# Patient Record
Sex: Female | Born: 1965 | Race: White | Hispanic: No | Marital: Married | Smoking: Never smoker
Health system: Southern US, Community
[De-identification: ages and names within clinical notes are randomized; demographics above are authoritative.]

## PROBLEM LIST (undated history)

## (undated) DIAGNOSIS — N200 Calculus of kidney: Secondary | ICD-10-CM

## (undated) DIAGNOSIS — R251 Tremor, unspecified: Secondary | ICD-10-CM

## (undated) DIAGNOSIS — M797 Fibromyalgia: Secondary | ICD-10-CM

## (undated) DIAGNOSIS — F4310 Post-traumatic stress disorder, unspecified: Secondary | ICD-10-CM

## (undated) DIAGNOSIS — F329 Major depressive disorder, single episode, unspecified: Secondary | ICD-10-CM

## (undated) DIAGNOSIS — M549 Dorsalgia, unspecified: Secondary | ICD-10-CM

## (undated) DIAGNOSIS — E039 Hypothyroidism, unspecified: Secondary | ICD-10-CM

## (undated) DIAGNOSIS — F419 Anxiety disorder, unspecified: Secondary | ICD-10-CM

## (undated) DIAGNOSIS — F319 Bipolar disorder, unspecified: Secondary | ICD-10-CM

## (undated) DIAGNOSIS — F32A Depression, unspecified: Secondary | ICD-10-CM

## (undated) DIAGNOSIS — I10 Essential (primary) hypertension: Secondary | ICD-10-CM

## (undated) DIAGNOSIS — G8929 Other chronic pain: Secondary | ICD-10-CM

## (undated) DIAGNOSIS — G43009 Migraine without aura, not intractable, without status migrainosus: Secondary | ICD-10-CM

## (undated) DIAGNOSIS — G43909 Migraine, unspecified, not intractable, without status migrainosus: Secondary | ICD-10-CM

## (undated) DIAGNOSIS — K219 Gastro-esophageal reflux disease without esophagitis: Secondary | ICD-10-CM

## (undated) DIAGNOSIS — K589 Irritable bowel syndrome without diarrhea: Secondary | ICD-10-CM

## (undated) HISTORY — DX: Irritable bowel syndrome, unspecified: K58.9

## (undated) HISTORY — DX: Calculus of kidney: N20.0

## (undated) HISTORY — DX: Post-traumatic stress disorder, unspecified: F43.10

## (undated) HISTORY — PX: OTHER SURGICAL HISTORY: SHX169

## (undated) HISTORY — DX: Dorsalgia, unspecified: M54.9

## (undated) HISTORY — DX: Anxiety disorder, unspecified: F41.9

## (undated) HISTORY — DX: Major depressive disorder, single episode, unspecified: F32.9

## (undated) HISTORY — DX: Fibromyalgia: M79.7

## (undated) HISTORY — DX: Other chronic pain: G89.29

## (undated) HISTORY — DX: Depression, unspecified: F32.A

## (undated) HISTORY — DX: Tremor, unspecified: R25.1

## (undated) HISTORY — DX: Essential (primary) hypertension: I10

## (undated) HISTORY — DX: Migraine without aura, not intractable, without status migrainosus: G43.009

## (undated) HISTORY — DX: Migraine, unspecified, not intractable, without status migrainosus: G43.909

## (undated) HISTORY — DX: Bipolar disorder, unspecified: F31.9

## (undated) HISTORY — PX: ABDOMINAL HYSTERECTOMY: SHX81

---

## 2004-03-01 ENCOUNTER — Emergency Department: Payer: Self-pay | Admitting: Emergency Medicine

## 2005-06-16 ENCOUNTER — Emergency Department: Payer: Self-pay | Admitting: Emergency Medicine

## 2006-02-01 ENCOUNTER — Emergency Department: Payer: Self-pay | Admitting: Emergency Medicine

## 2007-12-25 ENCOUNTER — Ambulatory Visit: Payer: Self-pay | Admitting: Internal Medicine

## 2008-01-13 ENCOUNTER — Emergency Department: Payer: Self-pay | Admitting: Emergency Medicine

## 2008-02-24 ENCOUNTER — Emergency Department: Payer: Self-pay | Admitting: Emergency Medicine

## 2008-09-05 ENCOUNTER — Observation Stay: Payer: Self-pay | Admitting: Internal Medicine

## 2008-09-25 DIAGNOSIS — I10 Essential (primary) hypertension: Secondary | ICD-10-CM | POA: Insufficient documentation

## 2008-10-02 ENCOUNTER — Emergency Department: Payer: Self-pay | Admitting: Emergency Medicine

## 2009-03-13 ENCOUNTER — Emergency Department (HOSPITAL_COMMUNITY): Admission: EM | Admit: 2009-03-13 | Discharge: 2009-03-13 | Payer: Self-pay | Admitting: Emergency Medicine

## 2009-10-21 ENCOUNTER — Emergency Department: Payer: Self-pay | Admitting: Emergency Medicine

## 2010-02-16 ENCOUNTER — Emergency Department: Payer: Self-pay | Admitting: Unknown Physician Specialty

## 2010-03-10 ENCOUNTER — Ambulatory Visit: Payer: Self-pay | Admitting: Gynecology

## 2010-03-19 ENCOUNTER — Ambulatory Visit: Payer: Self-pay | Admitting: Internal Medicine

## 2010-11-03 ENCOUNTER — Ambulatory Visit: Payer: Self-pay | Admitting: Family Medicine

## 2010-11-08 ENCOUNTER — Ambulatory Visit: Payer: Self-pay | Admitting: Family Medicine

## 2011-01-15 ENCOUNTER — Emergency Department: Payer: Self-pay | Admitting: Emergency Medicine

## 2011-03-27 ENCOUNTER — Emergency Department: Payer: Self-pay | Admitting: Emergency Medicine

## 2011-04-09 ENCOUNTER — Emergency Department: Payer: Self-pay | Admitting: Emergency Medicine

## 2012-03-28 ENCOUNTER — Emergency Department: Payer: Self-pay | Admitting: Emergency Medicine

## 2012-03-30 ENCOUNTER — Ambulatory Visit: Payer: Self-pay | Admitting: Podiatry

## 2012-03-30 LAB — CBC WITH DIFFERENTIAL/PLATELET
Basophil #: 0.1 10*3/uL (ref 0.0–0.1)
Basophil %: 1 %
Eosinophil #: 1 10*3/uL — ABNORMAL HIGH (ref 0.0–0.7)
Eosinophil %: 10.9 %
HCT: 38.1 % (ref 35.0–47.0)
HGB: 13 g/dL (ref 12.0–16.0)
Lymphocyte #: 3 10*3/uL (ref 1.0–3.6)
Lymphocyte %: 33.4 %
MCH: 31.2 pg (ref 26.0–34.0)
MCHC: 34.2 g/dL (ref 32.0–36.0)
MCV: 91 fL (ref 80–100)
Monocyte #: 0.8 x10 3/mm (ref 0.2–0.9)
Monocyte %: 8.9 %
Neutrophil #: 4.1 10*3/uL (ref 1.4–6.5)
Neutrophil %: 45.8 %
Platelet: 303 10*3/uL (ref 150–440)
RBC: 4.18 10*6/uL (ref 3.80–5.20)
RDW: 12.9 % (ref 11.5–14.5)
WBC: 9 10*3/uL (ref 3.6–11.0)

## 2012-05-02 ENCOUNTER — Ambulatory Visit: Payer: Self-pay | Admitting: Family Medicine

## 2012-08-04 ENCOUNTER — Emergency Department: Payer: Self-pay | Admitting: Emergency Medicine

## 2012-08-10 DIAGNOSIS — M549 Dorsalgia, unspecified: Secondary | ICD-10-CM | POA: Insufficient documentation

## 2012-09-29 ENCOUNTER — Emergency Department: Payer: Self-pay | Admitting: Emergency Medicine

## 2012-10-29 DIAGNOSIS — G629 Polyneuropathy, unspecified: Secondary | ICD-10-CM | POA: Insufficient documentation

## 2012-10-29 DIAGNOSIS — K219 Gastro-esophageal reflux disease without esophagitis: Secondary | ICD-10-CM | POA: Insufficient documentation

## 2013-02-03 DIAGNOSIS — G5600 Carpal tunnel syndrome, unspecified upper limb: Secondary | ICD-10-CM | POA: Insufficient documentation

## 2013-05-07 DIAGNOSIS — F419 Anxiety disorder, unspecified: Secondary | ICD-10-CM | POA: Insufficient documentation

## 2013-05-24 ENCOUNTER — Ambulatory Visit: Payer: Self-pay | Admitting: Specialist

## 2013-09-16 ENCOUNTER — Emergency Department: Payer: Self-pay | Admitting: Emergency Medicine

## 2013-09-16 LAB — URINALYSIS, COMPLETE
BACTERIA: NONE SEEN
BLOOD: NEGATIVE
Bilirubin,UR: NEGATIVE
GLUCOSE, UR: NEGATIVE mg/dL (ref 0–75)
Ketone: NEGATIVE
LEUKOCYTE ESTERASE: NEGATIVE
NITRITE: NEGATIVE
PH: 7 (ref 4.5–8.0)
PROTEIN: NEGATIVE
RBC,UR: <1 /HPF (ref 0–5)
Specific Gravity: 1.008 (ref 1.003–1.030)
WBC UR: <1 /HPF (ref 0–5)

## 2013-10-02 DIAGNOSIS — R519 Headache, unspecified: Secondary | ICD-10-CM | POA: Insufficient documentation

## 2013-10-02 DIAGNOSIS — R51 Headache: Secondary | ICD-10-CM

## 2013-10-18 ENCOUNTER — Ambulatory Visit: Payer: Self-pay | Admitting: Family Medicine

## 2014-05-23 NOTE — Op Note (Signed)
PATIENT NAME:  Alyssa Zuniga, Alyssa Zuniga MR#:  [id number] DATE OF BIRTH:  April 30, 1965  DATE OF PROCEDURE:  03/30/2012  PREOPERATIVE DIAGNOSIS: Right foot hallux valgus.   POSTOPERATIVE DIAGNOSIS: Right foot hallux valgus.   PROCEDURE PERFORMED:  [place] hallux valgus correction, right foot.   SURGEON: Vandy Tsuchiya A. Ether GriffinsFowler, DPM   ANESTHESIA: General with postoperative block with Exparel, 8 mL.   HEMOSTASIS: Ankle tourniquet inflated to 225 mmHg for 45 minutes.   COMPLICATIONS: None.   SPECIMEN: None.   OPERATIVE INDICATIONS: This is a 49 year old female who has been seen in the outpatient clinic with the complaint of a painful hallux valgus deformity on the right foot. We have discussed conservative versus surgical intervention and she presents today for surgery. All risks, benefits, alternatives and complications associated with surgery were discussed with the patient and informed consent has been given.   DESCRIPTION OF PROCEDURE: The patient was brought into the OR and placed on the operating table in the supine position. General laryngeal mask anesthesia was administered by the anesthesia team. The right lower extremity was then prepped and draped in the usual sterile fashion. After inflation of the tourniquet, a dorsomedial incision was made. Sharp and blunt dissection was taken down to the capsule. Next, a T-capsulotomy was performed and a dorsal medial eminence was noted and transected. Next, a V-osteotomy was created. The capital fragment was translocated laterally. This was stabilized with a 0.062 K wire. The ensuing overhanging ledge was then transected and all areas were smoothed with a power rasp. The foot was loaded. There was still noted to be a slight amount of hallux valgus and tension, in the intermetatarsal space, at this time. I entered the intermetatarsal space. The DTIL was transected. The conjoined tendon of the adductor hallucis was then removed from the base of the proximal phalanx. Much  better range of motion was noted afterwards. The wound was flushed with copious amounts of irrigation. The capsule was closed with a 3-0 Vicryl with a small capsulorrhaphy performed medially, 4-0 Vicryl for the subcutaneous tissue and a 5-0 Monocryl undyed for skin. A well-compressive sterile dressing was placed just after placement of 8 mL of Exparel long-acting Marcaine around the surgical site. Overall the patient tolerated the procedure and anesthesia well and was transported from the OR to the PAC-U with all vital signs stable and neurovascular status intact. I will see her in the outpatient clinic in 5 to 7 days.  ____________________________ Argentina DonovanJustin A. Ether GriffinsFowler, DPM jaf:sb D: 03/30/2012 08:41:58 ET T: 03/30/2012 09:36:09 ET JOB#: [id number]  cc: Jill AlexandersJustin A. Ether GriffinsFowler, DPM, <Dictator> Elbridge Magowan DPM ELECTRONICALLY SIGNED 04/18/2012 9:57

## 2014-07-05 HISTORY — PX: CARDIAC CATHETERIZATION: SHX172

## 2014-07-07 DIAGNOSIS — R0789 Other chest pain: Secondary | ICD-10-CM | POA: Insufficient documentation

## 2014-10-15 ENCOUNTER — Ambulatory Visit: Payer: Managed Care, Other (non HMO) | Attending: Anesthesiology | Admitting: Physical Therapy

## 2014-10-16 ENCOUNTER — Ambulatory Visit: Payer: Managed Care, Other (non HMO) | Admitting: Physical Therapy

## 2014-10-21 ENCOUNTER — Ambulatory Visit: Payer: Self-pay | Admitting: Physical Therapy

## 2014-10-23 ENCOUNTER — Ambulatory Visit: Payer: Self-pay | Admitting: Physical Therapy

## 2014-10-28 ENCOUNTER — Ambulatory Visit: Payer: Self-pay | Admitting: Physical Therapy

## 2014-11-05 ENCOUNTER — Ambulatory Visit (INDEPENDENT_AMBULATORY_CARE_PROVIDER_SITE_OTHER): Payer: Managed Care, Other (non HMO) | Admitting: Unknown Physician Specialty

## 2014-11-05 ENCOUNTER — Encounter: Payer: Self-pay | Admitting: Unknown Physician Specialty

## 2014-11-05 VITALS — BP 149/91 | HR 81 | Temp 97.6°F | Ht 62.2 in | Wt 139.8 lb

## 2014-11-05 DIAGNOSIS — G43909 Migraine, unspecified, not intractable, without status migrainosus: Secondary | ICD-10-CM | POA: Insufficient documentation

## 2014-11-05 DIAGNOSIS — R413 Other amnesia: Secondary | ICD-10-CM | POA: Diagnosis not present

## 2014-11-05 DIAGNOSIS — R61 Generalized hyperhidrosis: Secondary | ICD-10-CM

## 2014-11-05 DIAGNOSIS — E7800 Pure hypercholesterolemia, unspecified: Secondary | ICD-10-CM

## 2014-11-05 DIAGNOSIS — G43109 Migraine with aura, not intractable, without status migrainosus: Secondary | ICD-10-CM | POA: Diagnosis not present

## 2014-11-05 DIAGNOSIS — E039 Hypothyroidism, unspecified: Secondary | ICD-10-CM

## 2014-11-05 DIAGNOSIS — G4700 Insomnia, unspecified: Secondary | ICD-10-CM

## 2014-11-05 DIAGNOSIS — M797 Fibromyalgia: Secondary | ICD-10-CM | POA: Diagnosis not present

## 2014-11-05 MED ORDER — AMITRIPTYLINE HCL 10 MG PO TABS: 10.0000 mg | ORAL_TABLET | Freq: Every day | ORAL | Status: DC

## 2014-11-05 NOTE — Progress Notes (Signed)
BP 149/91 mmHg  Pulse 81  Temp(Src) 97.6 F (36.4 C)  Ht 5' 2.2" (1.58 m)  Wt 139 lb 12.8 oz (63.413 kg)  BMI 25.40 kg/m2  SpO2 97%  LMP  (LMP Unknown)   Subjective:    Patient ID: Alyssa Zuniga, female    DOB: Oct 20, 1965, 49 y.o.   MRN: [id number]  HPI: Alyssa Zuniga is a 49 y.o. female  Chief Complaint  Patient presents with  . Establish Care    pt states she has some vaginal disharge and odor, states she wants to have hormone levels checked   Nausea/vomintins/stomach pain Pt states she feels sick a lot.  She states she is nauseated and vomiting a lot.  Has been to the ER a couple of times.  States they just did labs in the ER.  She states they never did do a scan.  States she gets heart burn and a lot of gas.  She has had trouble with her liver and had scan.  She states she has been feeling this way for quite some time.  At one time felt it was due to so much serotonin.    S/P hysterectomy.  Having a lot of sweating at night.  Pain doctor suggested getting hormones checked.    Chronic pain Disk problems and fibromyalgia  ROS as below.  Pt would like to emphasize memory loss and confusin getting worse, sweating as noted above, headaches sudden sharp pains (not migraines), discharge and odor vaginal area.    Relevant past medical, surgical, family and social history reviewed and updated as indicated. Interim medical history since our last visit reviewed.  Reviewed outside charts from [place] and [place].   Allergies and medications reviewed and updated.  Review of Systems  Constitutional: Positive for diaphoresis, appetite change and fatigue. Negative for activity change.  HENT: Negative.   Eyes: Positive for visual disturbance.  Respiratory: Positive for shortness of breath.   Cardiovascular: Positive for chest pain and palpitations.  Gastrointestinal: Positive for nausea, abdominal pain, diarrhea and abdominal distention. Negative for constipation.  Endocrine: Positive for  cold intolerance and heat intolerance.  Genitourinary: Positive for vaginal discharge.  Musculoskeletal: Positive for myalgias.  Allergic/Immunologic: Negative.   Neurological: Positive for dizziness, weakness and light-headedness. Negative for speech difficulty.  Psychiatric/Behavioral: Positive for sleep disturbance and decreased concentration. The patient is nervous/anxious.     Per HPI unless specifically indicated above     Objective:    BP 149/91 mmHg  Pulse 81  Temp(Src) 97.6 F (36.4 C)  Ht 5' 2.2" (1.58 m)  Wt 139 lb 12.8 oz (63.413 kg)  BMI 25.40 kg/m2  SpO2 97%  LMP  (LMP Unknown)  Wt Readings from Last 3 Encounters:  11/05/14 139 lb 12.8 oz (63.413 kg)    Physical Exam  Constitutional: She is oriented to person, place, and time. She appears well-developed and well-nourished. No distress.  HENT:  Head: Normocephalic and atraumatic.  Eyes: Conjunctivae and lids are normal. Right eye exhibits no discharge. Left eye exhibits no discharge. No scleral icterus.  Neck: Normal range of motion. Neck supple.  Cardiovascular: Normal rate, regular rhythm and normal heart sounds.   Pulmonary/Chest: Effort normal and breath sounds normal. No respiratory distress.  Abdominal: Normal appearance. There is no splenomegaly or hepatomegaly.  Musculoskeletal: Normal range of motion.  Neurological: She is alert and oriented to person, place, and time.  Skin: Skin is warm, dry and intact. Rash noted. No pallor.  Petechial rash on  her back  Psychiatric: She has a normal mood and affect. Her behavior is normal. Judgment and thought content normal.     Assessment & Plan:   Problem List Items Addressed This Visit      Unprioritized   Hypothyroidism - Primary    Last TSH reviewed very low.  ? Cause of daphoresis.  Check TSH today      Relevant Medications   levothyroxine (SYNTHROID, LEVOTHROID) 112 MCG tablet   Diaphoresis    Check TSH      Relevant Orders   TSH   Memory loss     Stop Atorvastatin for a short time.        Relevant Orders   Comprehensive metabolic panel   Hypercholesteremia    LDL in 200's according to Mychart.  Would like a short trial off Atorvastatin and see if it helps memory loss      Relevant Medications   atorvastatin (LIPITOR) 40 MG tablet   Other Relevant Orders   Lipid Panel w/o Chol/HDL Ratio   Migraines   Relevant Medications   atorvastatin (LIPITOR) 40 MG tablet   escitalopram (LEXAPRO) 20 MG tablet   oxyCODONE (ROXICODONE) 15 MG immediate release tablet   naproxen (NAPROSYN) 500 MG tablet   pregabalin (LYRICA) 100 MG capsule   amitriptyline (ELAVIL) 10 MG tablet   Other Relevant Orders   Comprehensive metabolic panel   Insomnia    Stop Benadryl. Stop Topamax.  Start amitrytiline 10 mg QHS.1-2      Relevant Orders   TSH    Other Visit Diagnoses    Fibromyalgia        Relevant Medications    naproxen (NAPROSYN) 500 MG tablet    Other Relevant Orders    Vit D  25 hydroxy (rtn osteoporosis monitoring)    Vitamin B12    Tissue transglutaminase, IgA    Comprehensive metabolic panel        Follow up plan: Return in about 2 weeks (around 11/19/2014) for for physical.

## 2014-11-05 NOTE — Assessment & Plan Note (Signed)
Stop Atorvastatin for a short time.

## 2014-11-05 NOTE — Assessment & Plan Note (Addendum)
Check TSH 

## 2014-11-05 NOTE — Assessment & Plan Note (Signed)
Last TSH reviewed very low.  ? Cause of daphoresis.  Check TSH today

## 2014-11-05 NOTE — Assessment & Plan Note (Signed)
LDL in 200's according to Mychart.  Would like a short trial off Atorvastatin and see if it helps memory loss

## 2014-11-05 NOTE — Assessment & Plan Note (Signed)
Stop Benadryl. Stop Topamax.  Start amitrytiline 10 mg QHS.1-2

## 2014-11-06 ENCOUNTER — Encounter: Payer: Self-pay | Admitting: Unknown Physician Specialty

## 2014-11-06 LAB — COMPREHENSIVE METABOLIC PANEL
A/G RATIO: 1.6 (ref 1.1–2.5)
ALK PHOS: 84 IU/L (ref 39–117)
ALT: 55 IU/L — AB (ref 0–32)
AST: 40 IU/L (ref 0–40)
Albumin: 4.5 g/dL (ref 3.5–5.5)
BILIRUBIN TOTAL: 0.6 mg/dL (ref 0.0–1.2)
BUN / CREAT RATIO: 12 (ref 9–23)
BUN: 11 mg/dL (ref 6–24)
CHLORIDE: 105 mmol/L (ref 97–108)
CO2: 21 mmol/L (ref 18–29)
Calcium: 9.4 mg/dL (ref 8.7–10.2)
Creatinine, Ser: 0.95 mg/dL (ref 0.57–1.00)
GFR calc non Af Amer: 71 mL/min/{1.73_m2} (ref >59)
GFR, EST AFRICAN AMERICAN: 82 mL/min/{1.73_m2} (ref >59)
GLUCOSE: 109 mg/dL — AB (ref 65–99)
Globulin, Total: 2.8 g/dL (ref 1.5–4.5)
POTASSIUM: 3.6 mmol/L (ref 3.5–5.2)
Sodium: 142 mmol/L (ref 134–144)
Total Protein: 7.3 g/dL (ref 6.0–8.5)

## 2014-11-06 LAB — VITAMIN B12: VITAMIN B 12: 517 pg/mL (ref 211–946)

## 2014-11-06 LAB — LIPID PANEL W/O CHOL/HDL RATIO
Cholesterol, Total: 298 mg/dL — ABNORMAL HIGH (ref 100–199)
HDL: 38 mg/dL — ABNORMAL LOW (ref >39)
LDL CALC: 222 mg/dL — AB (ref 0–99)
Triglycerides: 188 mg/dL — ABNORMAL HIGH (ref 0–149)
VLDL CHOLESTEROL CAL: 38 mg/dL (ref 5–40)

## 2014-11-06 LAB — TSH: TSH: 1.48 u[IU]/mL (ref 0.450–4.500)

## 2014-11-06 LAB — VITAMIN D 25 HYDROXY (VIT D DEFICIENCY, FRACTURES): Vit D, 25-Hydroxy: 23.1 ng/mL — ABNORMAL LOW (ref 30.0–100.0)

## 2014-11-08 LAB — TISSUE TRANSGLUTAMINASE, IGA: Transglutaminase IgA: <2 U/mL (ref 0–3)

## 2014-12-10 ENCOUNTER — Encounter: Payer: Managed Care, Other (non HMO) | Admitting: Unknown Physician Specialty

## 2014-12-15 ENCOUNTER — Encounter: Payer: Self-pay | Admitting: Unknown Physician Specialty

## 2014-12-15 ENCOUNTER — Ambulatory Visit (INDEPENDENT_AMBULATORY_CARE_PROVIDER_SITE_OTHER): Payer: Managed Care, Other (non HMO) | Admitting: Unknown Physician Specialty

## 2014-12-15 VITALS — BP 170/113 | HR 77 | Temp 97.5°F | Ht 61.2 in | Wt 143.4 lb

## 2014-12-15 DIAGNOSIS — G8929 Other chronic pain: Secondary | ICD-10-CM

## 2014-12-15 DIAGNOSIS — M797 Fibromyalgia: Secondary | ICD-10-CM | POA: Diagnosis not present

## 2014-12-15 DIAGNOSIS — R1110 Vomiting, unspecified: Secondary | ICD-10-CM | POA: Insufficient documentation

## 2014-12-15 DIAGNOSIS — M7502 Adhesive capsulitis of left shoulder: Secondary | ICD-10-CM

## 2014-12-15 DIAGNOSIS — Z0000 Encounter for general adult medical examination without abnormal findings: Secondary | ICD-10-CM | POA: Diagnosis not present

## 2014-12-15 DIAGNOSIS — G43109 Migraine with aura, not intractable, without status migrainosus: Secondary | ICD-10-CM

## 2014-12-15 DIAGNOSIS — G43A1 Cyclical vomiting, intractable: Secondary | ICD-10-CM | POA: Diagnosis not present

## 2014-12-15 DIAGNOSIS — M7500 Adhesive capsulitis of unspecified shoulder: Secondary | ICD-10-CM | POA: Insufficient documentation

## 2014-12-15 DIAGNOSIS — E7800 Pure hypercholesterolemia, unspecified: Secondary | ICD-10-CM | POA: Diagnosis not present

## 2014-12-15 DIAGNOSIS — E559 Vitamin D deficiency, unspecified: Secondary | ICD-10-CM | POA: Diagnosis not present

## 2014-12-15 DIAGNOSIS — R1115 Cyclical vomiting syndrome unrelated to migraine: Secondary | ICD-10-CM

## 2014-12-15 NOTE — Assessment & Plan Note (Signed)
X-ray and PT referral

## 2014-12-15 NOTE — Assessment & Plan Note (Signed)
rx for ondansetron

## 2014-12-15 NOTE — Assessment & Plan Note (Signed)
Take 2 K/day

## 2014-12-15 NOTE — Patient Instructions (Addendum)
f you have chronic pain and are looking for alternatives to medication and surgery, you have a lot of options.   However, not all alternative pain treatments work. Some can even be risky. Some alternative treatments may help with pain from bad backs, osteoarthritis, and headaches, but have no effect on chronic pain from fibromyalgia or diabetic nerve damage.  Here's a rundown of the most commonly used alternative treatments for chronic pain.  Acupuncture. Once seen as bizarre, acupuncture is rapidly becoming a mainstream treatment for pain. Studies have found that it works for pain caused by many conditions, including fibromyalgia, osteoarthritis, back injuries, and sports injuries. How does it work? No one's quite sure. It could release pain-numbing chemicals in the body. Or it might block the pain signals coming from the nerves.  Exercise. Motion is lotion Going for a walk or swim isn't a treatment, exactly. But regular physical activity has big benefits for people with many different painful conditions. Study after study has found that physical activity can help relieve chronic pain, as well as boost energy and mood.   This is particularly true of pain related to arthritis .    Chiropractic manipulation. Although mainstream medicine has traditionally regarded spinal manipulation with suspicion, it's becoming a more accepted treatment.  I think many people respond will th chiropractic treatment.    Supplements and vitamins. There is evidence that certain dietary supplements and vitamins can help with certain types of pain. Fish oil and flaxseed oil is often used to reduce pain associated with swelling. Topical capsaicin, derived from chili peppers, may help with arthritis, diabetic nerve pain, and other conditions. There's evidence that glucosamine can help relieve moderate to severe pain from osteoarthritis in the knee.  A spice Tumeric is used my many to treat chronic pain.  Curcumin is the active  ingredient and thought to have anti-inflammatory effects and reduces pain and stiffness.  This can be found as capsules or extract (more likely to be free of contaminants). For OA: Capsule, typically 400 mg to 600 mg, three times per day; or 0.5 g to 1 g of powdered root up to 3 g per day. For RA: 500 mg twice daily.  It's best absorbed if it contains black pepper.  Tart cherry juice is a natural anti-inflammatory agent.  We sometimes hear from people who would like to know where to find cherries out of season. Some report that their local market does not carry cherry juice. There are a number of reputable online vendors who could supply cherry concentrate so you can use cherry juice to see if it helps your arthritic joints. Studies have been done with powdered [place] cherries, CherryPURE. The usual dose is 480 mg/day.  But when it comes to supplements, you have to be careful. High doses of B6 can damage the nerves.   Some studies suggest that supplements such as ginkgo biloba and ginseng can thin the blood and increase the risk of bleeding. This could lead to serious consequences for anyone getting surgery for chronic pain.  Finally, supplements don't always contain what they claim as supplement manufacturers are not regulated by the FDA.  I get very confused with the thousands of supplements available and which to choose.  Brands to consider: [employer], [employer], [employer], Garden [employer] Life, [employer], [employer] and [employer]   I usually go on Amazon and look for the highly rated products.  [employer] or [employer] are great resources and have done the research   for you.     Cognitive Behavioral Therapy. Some people with chronic pain balk at the idea of seeing a therapist -- they think it implies that their pain isn't real. But studies show that depression and chronic pain often go together. Chronic pain can cause or worsen depression; depression can lower a person's tolerance for  pain.  Stress-reduction techniques. There are number of approaches, including: Yoga. There's good evidence that yoga can help with chronic pain,  specifically fibromyalgia, neck pain, back pain, and arthritis. Relaxation therapy. This is actually a category of techniques that help people calm the body and release tension -- a process that might also reduce pain. Some approaches teach people how to focus on their breathing. Research shows that relaxation therapy can help with fibromyalgia, headache, osteoarthritis, and other conditions. Hypnosis. Studies have found this approach helpful with different sorts of pain, like back pain, repetitive strain injuries, and cancer pain. Guided imagery. Research shows that guided imagery can help with conditions like headache pain, cancer pain, osteoarthritis, and fibromyalgia. How does it work? An expert would teach you ways to direct your thoughts by focusing on specific images. Music therapy. This approach gets people to either perform or listen to music. Studies have found that it can help with many different pain conditions, like osteoarthritis and cancer pain. Biofeedback. This approach teaches you how to control normally unconscious bodily functions, like blood pressure or your heart rate. Studies have found that it can help with headaches, fibromyalgia, and other conditions. Massage. It's undeniably relaxing. And there's some evidence that massage can help ease pain from rheumatoid arthritis, neck and back injuries, and fibromyalgia.  Risky Alternative Pain Treatments Experts say you should keep your expectations for alternative pain treatments modest -- especially when it comes to "miracle cures." Controlling chronic pain is not simple. A single supplement, device, or treatment is not going to make your chronic pain disappear. You a need to be suspicious of anyone pushing a treatment when the financial motive is blatant. That doesn't only apply to pain  treatments advertised on dubious web sites asking for your credit card number.  Experts say that you should try to keep up to date with research into alternative treatments for chronic pain. The options for people with chronic pain are always growing -- and some of the odder treatments of today might become the mainstream treatments of tomorrow.   Read book From Fit to Fantastic by Dr. Nani Gasserietlebaum  No sugar diet  Start 2K vitamin/day  Start daily Tumeric  Magnesium citrate Natural Calm

## 2014-12-15 NOTE — Assessment & Plan Note (Signed)
Discuss diet.  Encouraged sugar free.  Discussed a gluten free diet

## 2014-12-15 NOTE — Progress Notes (Signed)
BP 170/113 mmHg  Pulse 77  Temp(Src) 97.5 F (36.4 C)  Ht 5' 1.2" (1.554 m)  Wt 143 lb 6.4 oz (65.046 kg)  BMI 26.94 kg/m2  SpO2 97%  LMP  (LMP Unknown)   Subjective:    Patient ID: Alyssa Zuniga, female    DOB: Jan 19, 1966, 49 y.o.   MRN: [id number]  HPI: Alyssa Zuniga is a 49 y.o. female  Chief Complaint  Patient presents with  . Annual Exam  . Nausea    pt states she has had nausea and vomitting that got bad about 2 weeks ago  . Generalized Body Aches    pt states she has body aches and a sore throat  . Shoulder Pain    pt states she is having left shoulder pain. States she tore rotator cuff a few years ago and thinks she reinjured it   Last visit we stopped her cholesterol medication which made no difference in her symptoms of memory loss and pain  History of cyclical nausea and vomiting which really got bad.  Despite going to the ER multiple times she has never went to a GI physician.    Low Vitamin D.  Just started a multi vitamin supplement that had 2K Vit D in it.    Reinjured left shoulder last year.  Thinks she has a rotator cuff.  Tried PT without improvement.    Migraines.  Restarted Topamax.    Relevant past medical, surgical, family and social history reviewed and updated as indicated. Interim medical history since our last visit reviewed. Allergies and medications reviewed and updated.  Review of Systems  Constitutional: Positive for fatigue.  HENT: Positive for sore throat.   Genitourinary:       Total hysterectomy.  Mammogram 2 years ago    Per HPI unless specifically indicated above     Objective:    BP 170/113 mmHg  Pulse 77  Temp(Src) 97.5 F (36.4 C)  Ht 5' 1.2" (1.554 m)  Wt 143 lb 6.4 oz (65.046 kg)  BMI 26.94 kg/m2  SpO2 97%  LMP  (LMP Unknown)  Wt Readings from Last 3 Encounters:  12/15/14 143 lb 6.4 oz (65.046 kg)  11/05/14 139 lb 12.8 oz (63.413 kg)    Physical Exam  Constitutional: She is oriented to person, place, and  time. She appears well-developed and well-nourished.  HENT:  Head: Normocephalic and atraumatic.  Eyes: Pupils are equal, round, and reactive to light. Right eye exhibits no discharge. Left eye exhibits no discharge. No scleral icterus.  Neck: Normal range of motion. Neck supple. Carotid bruit is not present. No thyromegaly present.  Cardiovascular: Normal rate, regular rhythm and normal heart sounds.  Exam reveals no gallop and no friction rub.   No murmur heard. Pulmonary/Chest: Effort normal and breath sounds normal. No respiratory distress. She has no wheezes. She has no rales.  Abdominal: Soft. Bowel sounds are normal. There is no tenderness. There is no rebound.  Genitourinary: No breast swelling, tenderness or discharge.  Musculoskeletal:       Left shoulder: She exhibits decreased range of motion and tenderness.  Lymphadenopathy:    She has no cervical adenopathy.  Neurological: She is alert and oriented to person, place, and time.  Skin: Skin is warm, dry and intact. No rash noted.  Psychiatric: She has a normal mood and affect. Her speech is normal and behavior is normal. Judgment and thought content normal. Cognition and memory are normal.    Results for  orders placed or performed in visit on 11/05/14  Lipid Panel w/o Chol/HDL Ratio  Result Value Ref Range   Cholesterol, Total 298 (H) 100 - 199 mg/dL   Triglycerides 409 (H) 0 - 149 mg/dL   HDL 38 (L) >81 mg/dL   VLDL Cholesterol Cal 38 5 - 40 mg/dL   LDL Calculated 191 (H) 0 - 99 mg/dL  TSH  Result Value Ref Range   TSH 1.480 0.450 - 4.500 uIU/mL  Vit D  25 hydroxy (rtn osteoporosis monitoring)  Result Value Ref Range   Vit D, 25-Hydroxy 23.1 (L) 30.0 - 100.0 ng/mL  Vitamin B12  Result Value Ref Range   Vitamin B-12 517 211 - 946 pg/mL  Tissue transglutaminase, IgA  Result Value Ref Range   Transglutaminase IgA <2 0 - 3 U/mL  Comprehensive metabolic panel  Result Value Ref Range   Glucose 109 (H) 65 - 99 mg/dL    BUN 11 6 - 24 mg/dL   Creatinine, Ser 4.78 0.57 - 1.00 mg/dL   GFR calc non Af Amer 71 >59 mL/min/1.73   GFR calc Af Amer 82 >59 mL/min/1.73   BUN/Creatinine Ratio 12 9 - 23   Sodium 142 134 - 144 mmol/L   Potassium 3.6 3.5 - 5.2 mmol/L   Chloride 105 97 - 108 mmol/L   CO2 21 18 - 29 mmol/L   Calcium 9.4 8.7 - 10.2 mg/dL   Total Protein 7.3 6.0 - 8.5 g/dL   Albumin 4.5 3.5 - 5.5 g/dL   Globulin, Total 2.8 1.5 - 4.5 g/dL   Albumin/Globulin Ratio 1.6 1.1 - 2.5   Bilirubin Total 0.6 0.0 - 1.2 mg/dL   Alkaline Phosphatase 84 39 - 117 IU/L   AST 40 0 - 40 IU/L   ALT 55 (H) 0 - 32 IU/L      Assessment & Plan:   Problem List Items Addressed This Visit      Unprioritized   Hypercholesteremia    Restart cholesterol meds      Migraines   Relevant Medications   topiramate (TOPAMAX) 50 MG tablet   oxyCODONE (OXYCONTIN) 20 mg 12 hr tablet   Oxycodone HCl 10 MG TABS   Fibromyalgia - Primary    Discuss diet.  Encouraged sugar free.  Discussed a gluten free diet      Chronic pain   Relevant Medications   topiramate (TOPAMAX) 50 MG tablet   oxyCODONE (OXYCONTIN) 20 mg 12 hr tablet   Oxycodone HCl 10 MG TABS   Vitamin D deficiency    Take 2 K/day      Frozen shoulder    X-ray and PT referral      Relevant Medications   oxyCODONE (OXYCONTIN) 20 mg 12 hr tablet   Oxycodone HCl 10 MG TABS   Other Relevant Orders   DG Shoulder Left   Ambulatory referral to Physical Therapy   Hyperemesis    rx for ondansetron       Other Visit Diagnoses    Routine general medical examination at a health care facility        Annual physical exam        Relevant Orders    MM DIGITAL SCREENING BILATERAL        Follow up plan: Return in about 6 weeks (around 01/26/2015).

## 2014-12-15 NOTE — Assessment & Plan Note (Signed)
Restart cholesterol meds 

## 2014-12-16 ENCOUNTER — Telehealth: Payer: Self-pay | Admitting: Unknown Physician Specialty
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
## 2014-12-17 NOTE — Telephone Encounter (Signed)
Patient returned my call and I let her know what Elnita MaxwellCheryl said.

## 2014-12-17 NOTE — Telephone Encounter (Signed)
Called and left patient a voicemail asking for her to please return my call.  

## 2014-12-17 NOTE — Telephone Encounter (Signed)
Thanks.  Please tell her she should let her pain doctor know as he prescribes most of her sedating medications.

## 2014-12-30 ENCOUNTER — Other Ambulatory Visit: Payer: Self-pay | Admitting: Unknown Physician Specialty

## 2014-12-30 ENCOUNTER — Ambulatory Visit: Payer: Managed Care, Other (non HMO) | Admitting: Physical Therapy

## 2014-12-31 ENCOUNTER — Other Ambulatory Visit: Payer: Self-pay

## 2014-12-31 MED ORDER — ESTRADIOL 1 MG PO TABS: 1.0000 mg | ORAL_TABLET | Freq: Every day | ORAL | Status: DC

## 2014-12-31 NOTE — Telephone Encounter (Signed)
Megan from [facility] in [place] called requesting a refill for patient's estradiol medication. Patient was last seen 12/15/14 and pharmacy is [facility] [place].

## 2015-01-05 ENCOUNTER — Ambulatory Visit: Payer: Self-pay | Admitting: Physical Therapy

## 2015-01-07 ENCOUNTER — Ambulatory Visit: Payer: Self-pay | Admitting: Physical Therapy

## 2015-01-08 ENCOUNTER — Encounter: Payer: Self-pay | Admitting: Unknown Physician Specialty

## 2015-01-12 ENCOUNTER — Ambulatory Visit: Payer: Managed Care, Other (non HMO) | Attending: Anesthesiology | Admitting: Physical Therapy

## 2015-01-14 ENCOUNTER — Ambulatory Visit (INDEPENDENT_AMBULATORY_CARE_PROVIDER_SITE_OTHER): Payer: Managed Care, Other (non HMO) | Admitting: Unknown Physician Specialty

## 2015-01-14 ENCOUNTER — Encounter: Payer: Self-pay | Admitting: Internal Medicine

## 2015-01-14 ENCOUNTER — Encounter: Payer: Self-pay | Admitting: Unknown Physician Specialty

## 2015-01-14 VITALS — BP 143/92 | HR 73 | Temp 97.2°F | Ht 62.2 in | Wt 139.8 lb

## 2015-01-14 DIAGNOSIS — R413 Other amnesia: Secondary | ICD-10-CM | POA: Diagnosis not present

## 2015-01-14 DIAGNOSIS — R159 Full incontinence of feces: Secondary | ICD-10-CM

## 2015-01-14 DIAGNOSIS — G43109 Migraine with aura, not intractable, without status migrainosus: Secondary | ICD-10-CM | POA: Diagnosis not present

## 2015-01-14 DIAGNOSIS — Z5181 Encounter for therapeutic drug level monitoring: Secondary | ICD-10-CM

## 2015-01-14 DIAGNOSIS — R251 Tremor, unspecified: Secondary | ICD-10-CM | POA: Diagnosis not present

## 2015-01-14 DIAGNOSIS — R197 Diarrhea, unspecified: Secondary | ICD-10-CM

## 2015-01-14 DIAGNOSIS — E7800 Pure hypercholesterolemia, unspecified: Secondary | ICD-10-CM | POA: Diagnosis not present

## 2015-01-14 DIAGNOSIS — R32 Unspecified urinary incontinence: Secondary | ICD-10-CM

## 2015-01-14 DIAGNOSIS — M797 Fibromyalgia: Secondary | ICD-10-CM | POA: Diagnosis not present

## 2015-01-14 DIAGNOSIS — G43A1 Cyclical vomiting, intractable: Secondary | ICD-10-CM | POA: Diagnosis not present

## 2015-01-14 DIAGNOSIS — R1115 Cyclical vomiting syndrome unrelated to migraine: Secondary | ICD-10-CM

## 2015-01-14 LAB — LIPID PANEL PICCOLO, WAIVED
CHOL/HDL RATIO PICCOLO,WAIVE: 6.1 mg/dL — AB
CHOLESTEROL PICCOLO, WAIVED: 214 mg/dL — AB (ref <200)
HDL CHOL PICCOLO, WAIVED: 35 mg/dL — AB (ref >59)
LDL CHOL CALC PICCOLO WAIVED: 132 mg/dL — AB (ref <100)
TRIGLYCERIDES PICCOLO,WAIVED: 234 mg/dL — AB (ref <150)
VLDL Chol Calc Piccolo,Waive: 47 mg/dL — ABNORMAL HIGH (ref <30)

## 2015-01-14 NOTE — Progress Notes (Signed)
BP 143/92 mmHg  Pulse 73  Temp(Src) 97.2 F (36.2 C)  Ht 5' 2.2" (1.58 m)  Wt 139 lb 12.8 oz (63.413 kg)  BMI 25.40 kg/m2  SpO2 98%  LMP  (LMP Unknown)   Subjective:    Patient ID: Alyssa Zuniga, female    DOB: Dec 06, 1965, 49 y.o.   MRN: [id number]  HPI: Alyssa CrowLinda G Schweigert is a 49 y.o. female  Chief Complaint  Patient presents with  . Dizziness    pt states she has been having dizziness, nausea, vomitting, SOB, headaches that turn to migraines, blurred/double vision, and bad tremors. Patient states symptoms started last Tuesday.   States she has the above symptoms and states "it is really bad."  Her pain doctor says this has nothing to do with her pain medications.  She is drinking a lot of lemon water and cutting back on sugar.  She feels she has been dehydrated due to vomiting and diarrhea.  She was so bad she couldn't get out of bed.  This lasted 4 days.  She has never seen a GI doctor.  She was tested for lupus once and said ANA was "off the charts" butnever checked again.    She has been doing a lot of research.  She does have tremors, incontinence of bowel or bladder, blurred vision, "dead peripheral nerves," short term memory loss, intermittent aphasia, and migraines. She wonders if she can have a MRI and wonders if she has MS.  She has never seen a neurologist.   Hyperlipidemia   High last visit.  Restarted on cholesterol meds.    Relevant past medical, surgical, family and social history reviewed and updated as indicated. Interim medical history since our last visit reviewed. Allergies and medications reviewed and updated.  Review of Systems  Per HPI unless specifically indicated above     Objective:    BP 143/92 mmHg  Pulse 73  Temp(Src) 97.2 F (36.2 C)  Ht 5' 2.2" (1.58 m)  Wt 139 lb 12.8 oz (63.413 kg)  BMI 25.40 kg/m2  SpO2 98%  LMP  (LMP Unknown)  Wt Readings from Last 3 Encounters:  01/14/15 139 lb 12.8 oz (63.413 kg)  12/15/14 143 lb 6.4 oz (65.046 kg)   11/05/14 139 lb 12.8 oz (63.413 kg)    Physical Exam  Results for orders placed or performed in visit on 11/05/14  Lipid Panel w/o Chol/HDL Ratio  Result Value Ref Range   Cholesterol, Total 298 (H) 100 - 199 mg/dL   Triglycerides 409188 (H) 0 - 149 mg/dL   HDL 38 (L) >81>39 mg/dL   VLDL Cholesterol Cal 38 5 - 40 mg/dL   LDL Calculated 191222 (H) 0 - 99 mg/dL  TSH  Result Value Ref Range   TSH 1.480 0.450 - 4.500 uIU/mL  Vit D  25 hydroxy (rtn osteoporosis monitoring)  Result Value Ref Range   Vit D, 25-Hydroxy 23.1 (L) 30.0 - 100.0 ng/mL  Vitamin B12  Result Value Ref Range   Vitamin B-12 517 211 - 946 pg/mL  Tissue transglutaminase, IgA  Result Value Ref Range   Transglutaminase IgA <2 0 - 3 U/mL  Comprehensive metabolic panel  Result Value Ref Range   Glucose 109 (H) 65 - 99 mg/dL   BUN 11 6 - 24 mg/dL   Creatinine, Ser 4.780.95 0.57 - 1.00 mg/dL   GFR calc non Af Amer 71 >59 mL/min/1.73   GFR calc Af Amer 82 >59 mL/min/1.73   BUN/Creatinine Ratio 12  9 - 23   Sodium 142 134 - 144 mmol/L   Potassium 3.6 3.5 - 5.2 mmol/L   Chloride 105 97 - 108 mmol/L   CO2 21 18 - 29 mmol/L   Calcium 9.4 8.7 - 10.2 mg/dL   Total Protein 7.3 6.0 - 8.5 g/dL   Albumin 4.5 3.5 - 5.5 g/dL   Globulin, Total 2.8 1.5 - 4.5 g/dL   Albumin/Globulin Ratio 1.6 1.1 - 2.5   Bilirubin Total 0.6 0.0 - 1.2 mg/dL   Alkaline Phosphatase 84 39 - 117 IU/L   AST 40 0 - 40 IU/L   ALT 55 (H) 0 - 32 IU/L      Assessment & Plan:   Problem List Items Addressed This Visit      Unprioritized   Memory loss   Relevant Orders   Ambulatory referral to Neurology   Antinuclear Antib (ANA)   Comprehensive metabolic panel   Hypercholesteremia   Relevant Orders   Lipid Panel Piccolo, Waived   Migraines   Relevant Medications   amitriptyline (ELAVIL) 10 MG tablet   Other Relevant Orders   Ambulatory referral to Neurology   Fibromyalgia   Relevant Orders   Antinuclear Antib (ANA)   Comprehensive metabolic panel    Hyperemesis   Relevant Orders   Ambulatory referral to Gastroenterology   Tremors of nervous system - Primary   Relevant Orders   Ambulatory referral to Neurology   Antinuclear Antib (ANA)   Incontinence of bowel   Relevant Orders   Ambulatory referral to Neurology   Incontinence of urine   Relevant Orders   Ambulatory referral to Neurology    Other Visit Diagnoses    Diarrhea, unspecified type        Relevant Orders    Ambulatory referral to Gastroenterology    Comprehensive metabolic panel    Medication monitoring encounter        Relevant Orders    Comprehensive metabolic panel       Pt with constellation of neurological complaints.  ? MS ? Migraine syndrome ? Fibromyalgic type symptoms.  .  Refer to neurology  Refer to GI with episodic diarrhea and hyperemesis  Follow up plan: Return in about 3 months (around 04/14/2015).

## 2015-01-15 ENCOUNTER — Other Ambulatory Visit: Payer: Self-pay | Admitting: Unknown Physician Specialty

## 2015-01-15 LAB — COMPREHENSIVE METABOLIC PANEL
ALBUMIN: 3.7 g/dL (ref 3.5–5.5)
ALK PHOS: 80 IU/L (ref 39–117)
ALT: 55 IU/L — AB (ref 0–32)
AST: 47 IU/L — AB (ref 0–40)
Albumin/Globulin Ratio: 1.5 (ref 1.1–2.5)
BILIRUBIN TOTAL: 0.7 mg/dL (ref 0.0–1.2)
BUN/Creatinine Ratio: 12 (ref 9–23)
BUN: 10 mg/dL (ref 6–24)
CHLORIDE: 102 mmol/L (ref 96–106)
CO2: 25 mmol/L (ref 18–29)
CREATININE: 0.81 mg/dL (ref 0.57–1.00)
Calcium: 9 mg/dL (ref 8.7–10.2)
GFR calc Af Amer: 99 mL/min/{1.73_m2} (ref >59)
GFR calc non Af Amer: 86 mL/min/{1.73_m2} (ref >59)
GLUCOSE: 97 mg/dL (ref 65–99)
Globulin, Total: 2.5 g/dL (ref 1.5–4.5)
Potassium: 3.6 mmol/L (ref 3.5–5.2)
Sodium: 142 mmol/L (ref 134–144)
Total Protein: 6.2 g/dL (ref 6.0–8.5)

## 2015-01-15 LAB — ANA: ANA: POSITIVE — AB

## 2015-01-15 NOTE — Telephone Encounter (Signed)
Pt called stated she is out of her Amitriptyline. Needs the medication ASAP. Pharm is [facility] in [place]. Thanks.

## 2015-01-16 MED ORDER — AMITRIPTYLINE HCL 10 MG PO TABS: 10.0000 mg | ORAL_TABLET | Freq: Every day | ORAL | Status: DC | PRN

## 2015-01-16 NOTE — Telephone Encounter (Signed)
Patient was last seen 01/14/15 and pharmacy is [facility] in [place].

## 2015-01-27 ENCOUNTER — Ambulatory Visit: Payer: Managed Care, Other (non HMO) | Admitting: Unknown Physician Specialty

## 2015-01-29 ENCOUNTER — Telehealth: Payer: Self-pay | Admitting: Unknown Physician Specialty

## 2015-01-29 ENCOUNTER — Other Ambulatory Visit: Payer: Self-pay | Admitting: Unknown Physician Specialty

## 2015-01-29 DIAGNOSIS — R768 Other specified abnormal immunological findings in serum: Secondary | ICD-10-CM

## 2015-01-29 MED ORDER — POTASSIUM CHLORIDE CRYS ER 20 MEQ PO TBCR: 20.0000 meq | EXTENDED_RELEASE_TABLET | Freq: Every day | ORAL | Status: DC

## 2015-01-29 MED ORDER — TOPIRAMATE 50 MG PO TABS: 50.0000 mg | ORAL_TABLET | Freq: Two times a day (BID) | ORAL | Status: DC

## 2015-01-29 MED ORDER — ATORVASTATIN CALCIUM 40 MG PO TABS: 40.0000 mg | ORAL_TABLET | Freq: Every day | ORAL | Status: DC

## 2015-01-29 MED ORDER — LEVOTHYROXINE SODIUM 112 MCG PO TABS: 112.0000 ug | ORAL_TABLET | Freq: Every day | ORAL | Status: DC

## 2015-01-29 MED ORDER — AMITRIPTYLINE HCL 10 MG PO TABS: 10.0000 mg | ORAL_TABLET | Freq: Every day | ORAL | Status: DC | PRN

## 2015-01-29 MED ORDER — ESTRADIOL 1 MG PO TABS: 1.0000 mg | ORAL_TABLET | Freq: Every day | ORAL | Status: DC

## 2015-01-29 MED ORDER — OMEPRAZOLE 40 MG PO CPDR: 40.0000 mg | DELAYED_RELEASE_CAPSULE | Freq: Every day | ORAL | Status: DC

## 2015-01-29 NOTE — Telephone Encounter (Signed)
Pt needs all meds sent for 90 day supply to [facility] [phone].

## 2015-01-29 NOTE — Telephone Encounter (Signed)
Routing to provider. Patient was seen 01/14/15. Pharmacy was changed to patient's preference in chart.

## 2015-02-03 ENCOUNTER — Ambulatory Visit: Payer: Managed Care, Other (non HMO) | Admitting: Internal Medicine

## 2015-02-03 ENCOUNTER — Other Ambulatory Visit: Payer: Self-pay | Admitting: Unknown Physician Specialty

## 2015-02-05 ENCOUNTER — Ambulatory Visit
Admission: EM | Admit: 2015-02-05 | Discharge: 2015-02-05 | Disposition: A | Discharge status: Home / Self Care | Payer: Managed Care, Other (non HMO) | Attending: Family Medicine | Admitting: Family Medicine

## 2015-02-05 ENCOUNTER — Encounter: Payer: Self-pay | Admitting: Gynecology

## 2015-02-05 DIAGNOSIS — J029 Acute pharyngitis, unspecified: Secondary | ICD-10-CM | POA: Diagnosis not present

## 2015-02-05 DIAGNOSIS — J020 Streptococcal pharyngitis: Secondary | ICD-10-CM | POA: Diagnosis not present

## 2015-02-05 DIAGNOSIS — G4700 Insomnia, unspecified: Secondary | ICD-10-CM

## 2015-02-05 DIAGNOSIS — R110 Nausea: Secondary | ICD-10-CM

## 2015-02-05 LAB — RAPID INFLUENZA A&B ANTIGENS (ARMC ONLY)
INFLUENZA A (ARMC): NOT DETECTED
INFLUENZA B (ARMC): NOT DETECTED

## 2015-02-05 LAB — RAPID STREP SCREEN (MED CTR MEBANE ONLY): Streptococcus, Group A Screen (Direct): POSITIVE — AB

## 2015-02-05 MED ORDER — AZITHROMYCIN 250 MG PO TABS: ORAL_TABLET | ORAL | Status: DC

## 2015-02-05 MED ORDER — BUTALBITAL-APAP-CAFFEINE 50-325-40 MG PO TABS: 1.0000 | ORAL_TABLET | Freq: Four times a day (QID) | ORAL | Status: DC | PRN

## 2015-02-05 MED ORDER — ONDANSETRON 8 MG PO TBDP: 8.0000 mg | ORAL_TABLET | Freq: Three times a day (TID) | ORAL | Status: DC | PRN

## 2015-02-05 NOTE — Discharge Instructions (Signed)
Pharyngitis °Pharyngitis is a sore throat (pharynx). There is redness, pain, and swelling of your throat. °HOME CARE  °· Drink enough fluids to keep your pee (urine) clear or pale yellow. °· Only take medicine as told by your doctor. °¨ You may get sick again if you do not take medicine as told. Finish your medicines, even if you start to feel better. °¨ Do not take aspirin. °· Rest. °· Rinse your mouth (gargle) with salt water (½ tsp of salt per 1 qt of water) every 1-2 hours. This will help the pain. °· If you are not at risk for choking, you can suck on hard candy or sore throat lozenges. °GET HELP IF: °· You have large, tender lumps on your neck. °· You have a rash. °· You cough up [place], yellow-brown, or bloody spit. °GET HELP RIGHT AWAY IF:  °· You have a stiff neck. °· You drool or cannot swallow liquids. °· You throw up (vomit) or are not able to keep medicine or liquids down. °· You have very bad pain that does not go away with medicine. °· You have problems breathing (not from a stuffy nose). °MAKE SURE YOU:  °· Understand these instructions. °· Will watch your condition. °· Will get help right away if you are not doing well or get worse. °  °This information is not intended to replace advice given to you by your health care provider. Make sure you discuss any questions you have with your health care provider. °  °Document Released: 07/06/2007 Document Revised: 11/07/2012 Document Reviewed: 09/24/2012 °Elsevier Interactive Patient Education ©2016 [employer] ° °Strep Throat °Strep throat is an infection of the throat. It is caused by germs. Strep throat spreads from person to person because of coughing, sneezing, or close contact. °HOME CARE °Medicines  °· Take over-the-counter and prescription medicines only as told by your doctor. °· Take your antibiotic medicine as told by your doctor. Do not stop taking the medicine even if you feel better. °· Have family members who also have a sore throat or fever  go to a doctor. °Eating and Drinking  °· Do not share food, drinking cups, or personal items. °· Try eating soft foods until your sore throat feels better. °· Drink enough fluid to keep your pee (urine) clear or pale yellow. °General Instructions °· Rinse your mouth (gargle) with a salt-water mixture 3-4 times per day or as needed. To make a salt-water mixture, stir ½-1 tsp of salt into 1 cup of warm water. °· Make sure that all people in your house wash their hands well. °· Rest. °· Stay home from school or work until you have been taking antibiotics for 24 hours. °· Keep all follow-up visits as told by your doctor. This is important. °GET HELP IF: °· Your neck keeps getting bigger. °· You get a rash, cough, or earache. °· You cough up thick liquid that is [place], yellow-brown, or bloody. °· You have pain that does not get better with medicine. °· Your problems get worse instead of getting better. °· You have a fever. °GET HELP RIGHT AWAY IF: °· You throw up (vomit). °· You get a very bad headache. °· You neck hurts or it feels stiff. °· You have chest pain or you are short of breath. °· You have drooling, very bad throat pain, or changes in your voice. °· Your neck is swollen or the skin gets red and tender. °· Your mouth is dry or you are peeing less than   normal. °· You keep feeling more tired or it is hard to wake up. °· Your joints are red or they hurt. °  °This information is not intended to replace advice given to you by your health care provider. Make sure you discuss any questions you have with your health care provider. °  °Document Released: 07/06/2007 Document Revised: 10/08/2014 Document Reviewed: 05/12/2014 °Elsevier Interactive Patient Education ©2016 [employer]. ° °Upper Respiratory Infection, Adult °Most upper respiratory infections (URIs) are caused by a virus. A URI affects the nose, throat, and upper air passages. The most common type of URI is often called "the common cold." °HOME CARE  °· Take  medicines only as told by your doctor. °· Gargle warm saltwater or take cough drops to comfort your throat as told by your doctor. °· Use a warm mist humidifier or inhale steam from a shower to increase air moisture. This may make it easier to breathe. °· Drink enough fluid to keep your pee (urine) clear or pale yellow. °· Eat soups and other clear broths. °· Have a healthy diet. °· Rest as needed. °· Go back to work when your fever is gone or your doctor says it is okay. °¨ You may need to stay home longer to avoid giving your URI to others. °¨ You can also wear a face mask and wash your hands often to prevent spread of the virus. °· Use your inhaler more if you have asthma. °· Do not use any tobacco products, including cigarettes, chewing tobacco, or electronic cigarettes. If you need help quitting, ask your doctor. °GET HELP IF: °· You are getting worse, not better. °· Your symptoms are not helped by medicine. °· You have chills. °· You are getting more short of breath. °· You have brown or red mucus. °· You have yellow or brown discharge from your nose. °· You have pain in your face, especially when you bend forward. °· You have a fever. °· You have puffy (swollen) neck glands. °· You have pain while swallowing. °· You have white areas in the back of your throat. °GET HELP RIGHT AWAY IF:  °· You have very bad or constant: °¨ Headache. °¨ Ear pain. °¨ Pain in your forehead, behind your eyes, and over your cheekbones (sinus pain). °¨ Chest pain. °· You have long-lasting (chronic) lung disease and any of the following: °¨ Wheezing. °¨ Long-lasting cough. °¨ Coughing up blood. °¨ A change in your usual mucus. °· You have a stiff neck. °· You have changes in your: °¨ Vision. °¨ Hearing. °¨ Thinking. °¨ Mood. °MAKE SURE YOU:  °· Understand these instructions. °· Will watch your condition. °· Will get help right away if you are not doing well or get worse. °  °This information is not intended to replace advice given to  you by your health care provider. Make sure you discuss any questions you have with your health care provider. °  °Document Released: 07/06/2007 Document Revised: 06/03/2014 Document Reviewed: 04/24/2013 °Elsevier Interactive Patient Education ©2016 Elsevier Inc. ° °

## 2015-02-05 NOTE — ED Provider Notes (Signed)
CSN: [id number]     Arrival date & time 02/05/15  1553 History   First MD Initiated Contact with Patient 02/05/15 1855    Nurses notes were reviewed.  No chief complaint on file.  Patient reports fever and sore throat started Sunday New Year's day throat is progressively gotten worse. She develops white spots on. She's been achy all over and feels miserable. She also states she has history of migraines and supplemented get her migraine medicines by mail order in yet. She is having difficulty sleeping at night and does have some nausea with her illness as well.  She is disabled due to fibromyalgia and other medical problems is chronic pain migraines chronic back pain hypertension disease and anxiety disorder. Generally cardiac catheterization in June. BI-RADS very prominent febrile in the family.  (Consider location/radiation/quality/duration/timing/severity/associated sxs/prior Treatment) HPI  Past Medical History  Diagnosis Date  . Hypertension   . Thyroid disease   . Fibromyalgia   . Chronic back pain   . Migraines   . IBS (irritable bowel syndrome)   . Depression   . Anxiety   . Headache, common migraine   . Chronic pain   . Occasional tremors    Past Surgical History  Procedure Laterality Date  . Abdominal hysterectomy    . Cesarean section    . Bunion removal Right   . Cardiac catheterization  07/05/14   Family History  Problem Relation Age of Onset  . Depression Mother   . Anxiety disorder Mother   . Cancer Mother     breast  . Migraines Mother   . Stroke Father   . Heart disease Father     MI  . Alcohol abuse Father   . Arthritis Father   . Migraines Father   . Thyroid disease Father   . Thyroid disease Maternal Grandmother   . Hyperlipidemia Maternal Grandmother   . Migraines Brother   . Anxiety disorder Brother   . Heart disease Maternal Grandfather     MI  . Hyperlipidemia Paternal Grandmother   . Thyroid disease Paternal Grandmother   . Heart disease  Paternal Grandfather     MI  . Migraines Daughter   . Migraines Son   . Migraines Son   . Migraines Daughter    Social History  Substance Use Topics  . Smoking status: Never Smoker   . Smokeless tobacco: Never Used  . Alcohol Use: No   OB History    No data available     Review of Systems  Constitutional: Positive for fever and appetite change.  HENT: Positive for sinus pressure and sore throat.     Allergies  Penicillins and Sulfa antibiotics  Home Medications   Prior to Admission medications   Medication Sig Start Date End Date Taking? Authorizing Provider  amitriptyline (ELAVIL) 10 MG tablet Take 1 tablet (10 mg total) by mouth daily as needed. 01/29/15  Yes Gabriel Cirri, NP  atorvastatin (LIPITOR) 40 MG tablet Take 1 tablet (40 mg total) by mouth daily. 01/29/15 01/29/16 Yes Gabriel Cirri, NP  calcium carbonate (TUMS EX) 750 MG chewable tablet Chew 1 tablet by mouth daily.   Yes Historical Provider, MD  estradiol (ESTRACE) 1 MG tablet Take 1 tablet (1 mg total) by mouth daily. 01/29/15  Yes Gabriel Cirri, NP  levothyroxine (SYNTHROID, LEVOTHROID) 112 MCG tablet Take 1 tablet (112 mcg total) by mouth daily. 01/29/15  Yes Gabriel Cirri, NP  Multiple Vitamins-Minerals (MULTI + OMEGA-3 ADULT GUMMIES PO) Take by mouth  daily.   Yes Historical Provider, MD  naproxen (NAPROSYN) 500 MG tablet Take 500 mg by mouth every 6 (six) hours. 10/06/14  Yes Historical Provider, MD  omeprazole (PRILOSEC) 40 MG capsule Take 1 capsule (40 mg total) by mouth daily. 01/29/15  Yes Gabriel Cirriheryl Wicker, NP  ondansetron (ZOFRAN-ODT) 4 MG disintegrating tablet Take 4 mg by mouth every 8 (eight) hours as needed for nausea or vomiting.   Yes Historical Provider, MD  oxyCODONE (OXYCONTIN) 20 mg 12 hr tablet Take 20 mg by mouth every 12 (twelve) hours.   Yes Historical Provider, MD  Oxycodone HCl 10 MG TABS Take 10 mg by mouth 3 (three) times daily as needed.   Yes Historical Provider, MD  potassium chloride SA  (K-DUR,KLOR-CON) 20 MEQ tablet Take 1 tablet (20 mEq total) by mouth daily. 01/29/15 01/29/16 Yes Gabriel Cirriheryl Wicker, NP  pregabalin (LYRICA) 100 MG capsule Take 100 mg by mouth 3 (three) times daily.   Yes Historical Provider, MD  topiramate (TOPAMAX) 50 MG tablet Take 1 tablet (50 mg total) by mouth 2 (two) times daily. 01/29/15  Yes Gabriel Cirriheryl Wicker, NP  topiramate (TOPAMAX) 50 MG tablet TAKE 1 TABLET BY MOUTH TWICE DAILY 02/03/15  Yes Gabriel Cirriheryl Wicker, NP  Zinc 25 MG TABS Take 25 mg by mouth daily.   Yes Historical Provider, MD  azithromycin (ZITHROMAX Z-PAK) 250 MG tablet Take 2 tablets first day and then 1 po a day for 4 days 02/05/15   Hassan RowanEugene Yuya Vanwingerden, MD  butalbital-acetaminophen-caffeine [facility](FIORICET) [phone] MG tablet Take 1-2 tablets by mouth every 6 (six) hours as needed for headache. 02/05/15 02/05/16  Hassan RowanEugene Deerica Waszak, MD  ondansetron (ZOFRAN ODT) 8 MG disintegrating tablet Take 1 tablet (8 mg total) by mouth every 8 (eight) hours as needed for nausea or vomiting. 02/05/15   Hassan RowanEugene Yaresly Menzel, MD   Meds Ordered and Administered this Visit  Medications - No data to display  BP 117/88 mmHg  Pulse 83  Temp(Src) 98.2 F (36.8 C) (Oral)  Resp 16  Ht 5\' 2"  (1.575 m)  Wt 135 lb (61.236 kg)  BMI 24.69 kg/m2  SpO2 100%  LMP  (LMP Unknown) No data found.   Physical Exam  Constitutional: She is oriented to person, place, and time. She appears well-developed. She appears lethargic. She appears ill.  HENT:  Head: Normocephalic.  Right Ear: Hearing, tympanic membrane, external ear and ear canal normal.  Left Ear: Hearing, tympanic membrane, external ear and ear canal normal.  Nose: Mucosal edema and rhinorrhea present.  Mouth/Throat: Normal dentition. No dental caries. Posterior oropharyngeal erythema present.  Eyes: Pupils are equal, round, and reactive to light.  Neck: Trachea normal. Neck supple.  Musculoskeletal: Normal range of motion.  Lymphadenopathy:    She has cervical adenopathy.  Neurological: She is  oriented to person, place, and time. She appears lethargic.  Skin: Skin is warm and dry.  Psychiatric: She has a normal mood and affect.  Vitals reviewed.   ED Course  Procedures (including critical care time)  Labs Review Labs Reviewed  RAPID STREP SCREEN (NOT AT [facility]) - Abnormal; Notable for the following:    Streptococcus, Group A Screen (Direct) POSITIVE (*)    All other components within normal limits  RAPID INFLUENZA A&B ANTIGENS ([facility] ONLY)    Imaging Review No results found.   Visual Acuity Review  Right Eye Distance:   Left Eye Distance:   Bilateral Distance:    Right Eye Near:   Left Eye Near:    Bilateral  Near:       Results for orders placed or performed during the hospital encounter of 02/05/15  Rapid strep screen  Result Value Ref Range   Streptococcus, Group A Screen (Direct) POSITIVE (A) NEGATIVE  Rapid Influenza A&B Antigens ([facility] only)  Result Value Ref Range   Influenza A ([facility]) NOT DETECTED    Influenza B ([facility]) NOT DETECTED     MDM   1. Strep sore throat   2. Pharyngitis   3. Nausea   4. Insomnia    Patient with a positive strep, negative flu. Will place on Zithromax due to penicillin allergies. Will place on fioricet for headache and insomnia. Zofran for nausea. See PCP for POC in two weeks.     Hassan Rowan, MD 02/05/15 [phone]

## 2015-02-05 NOTE — ED Notes (Signed)
Patient c/o sore throat / fver at home 102 / white spots on throat / swollen / body ace / vomiting x 4 days.

## 2015-02-11 ENCOUNTER — Ambulatory Visit: Payer: Managed Care, Other (non HMO) | Admitting: Diagnostic Neuroimaging

## 2015-02-12 ENCOUNTER — Telehealth: Payer: Self-pay | Admitting: Unknown Physician Specialty

## 2015-02-12 ENCOUNTER — Encounter: Payer: Self-pay | Admitting: Diagnostic Neuroimaging

## 2015-02-12 NOTE — Telephone Encounter (Signed)
Forward to provider

## 2015-02-12 NOTE — Telephone Encounter (Signed)
[facility] called stated pt is out of medication, wanted to know if a [facility] day [facility] [place]. Pt needs the following meds:  Topiramate Estradiol   Thanks.

## 2015-02-13 MED ORDER — ESTRADIOL 1 MG PO TABS: 1.0000 mg | ORAL_TABLET | Freq: Every day | ORAL | Status: DC

## 2015-02-13 MED ORDER — TOPIRAMATE 50 MG PO TABS: 50.0000 mg | ORAL_TABLET | Freq: Two times a day (BID) | ORAL | Status: DC

## 2015-02-13 NOTE — Telephone Encounter (Signed)
Done

## 2015-02-23 ENCOUNTER — Emergency Department: Payer: Managed Care, Other (non HMO)

## 2015-02-23 ENCOUNTER — Emergency Department
Admission: EM | Admit: 2015-02-23 | Discharge: 2015-02-23 | Disposition: A | Discharge status: Home / Self Care | Payer: Managed Care, Other (non HMO) | Attending: Emergency Medicine | Admitting: Emergency Medicine

## 2015-02-23 DIAGNOSIS — I10 Essential (primary) hypertension: Secondary | ICD-10-CM | POA: Diagnosis not present

## 2015-02-23 DIAGNOSIS — N201 Calculus of ureter: Secondary | ICD-10-CM | POA: Insufficient documentation

## 2015-02-23 DIAGNOSIS — N130 Hydronephrosis with ureteropelvic junction obstruction: Secondary | ICD-10-CM | POA: Diagnosis not present

## 2015-02-23 DIAGNOSIS — Z880 Allergy status to penicillin: Secondary | ICD-10-CM | POA: Insufficient documentation

## 2015-02-23 DIAGNOSIS — Z79899 Other long term (current) drug therapy: Secondary | ICD-10-CM | POA: Insufficient documentation

## 2015-02-23 DIAGNOSIS — Q6211 Congenital occlusion of ureteropelvic junction: Secondary | ICD-10-CM

## 2015-02-23 DIAGNOSIS — R109 Unspecified abdominal pain: Secondary | ICD-10-CM | POA: Diagnosis present

## 2015-02-23 DIAGNOSIS — R319 Hematuria, unspecified: Secondary | ICD-10-CM

## 2015-02-23 LAB — CBC
HCT: 43.2 % (ref 35.0–47.0)
Hemoglobin: 14.8 g/dL (ref 12.0–16.0)
MCH: 30.4 pg (ref 26.0–34.0)
MCHC: 34.2 g/dL (ref 32.0–36.0)
MCV: 88.9 fL (ref 80.0–100.0)
PLATELETS: 485 10*3/uL — AB (ref 150–440)
RBC: 4.86 MIL/uL (ref 3.80–5.20)
RDW: 13 % (ref 11.5–14.5)
WBC: 14.4 10*3/uL — ABNORMAL HIGH (ref 3.6–11.0)

## 2015-02-23 LAB — URINALYSIS COMPLETE WITH MICROSCOPIC (ARMC ONLY)
BILIRUBIN URINE: NEGATIVE
GLUCOSE, UA: NEGATIVE mg/dL
Hgb urine dipstick: NEGATIVE
KETONES UR: NEGATIVE mg/dL
LEUKOCYTES UA: NEGATIVE
NITRITE: NEGATIVE
PROTEIN: NEGATIVE mg/dL
SPECIFIC GRAVITY, URINE: 1.015 (ref 1.005–1.030)
pH: 5 (ref 5.0–8.0)

## 2015-02-23 LAB — COMPREHENSIVE METABOLIC PANEL
ALT: 31 U/L (ref 14–54)
ANION GAP: 10 (ref 5–15)
AST: 24 U/L (ref 15–41)
Albumin: 4.2 g/dL (ref 3.5–5.0)
Alkaline Phosphatase: 77 U/L (ref 38–126)
BUN: 11 mg/dL (ref 6–20)
CALCIUM: 10.2 mg/dL (ref 8.9–10.3)
CHLORIDE: 109 mmol/L (ref 101–111)
CO2: 24 mmol/L (ref 22–32)
Creatinine, Ser: 0.92 mg/dL (ref 0.44–1.00)
GFR calc non Af Amer: >60 mL/min (ref >60)
Glucose, Bld: 123 mg/dL — ABNORMAL HIGH (ref 65–99)
Potassium: 3.7 mmol/L (ref 3.5–5.1)
SODIUM: 143 mmol/L (ref 135–145)
Total Bilirubin: 0.9 mg/dL (ref 0.3–1.2)
Total Protein: 8.6 g/dL — ABNORMAL HIGH (ref 6.5–8.1)

## 2015-02-23 LAB — LIPASE, BLOOD: LIPASE: 26 U/L (ref 11–51)

## 2015-02-23 MED ORDER — FENTANYL CITRATE (PF) 100 MCG/2ML IJ SOLN
50.0000 ug | Freq: Once | INTRAMUSCULAR | Status: AC
  Administered 2015-02-23: 50 ug via INTRAVENOUS

## 2015-02-23 MED ORDER — OXYCODONE-ACETAMINOPHEN 5-325 MG PO TABS: 1.0000 | ORAL_TABLET | Freq: Four times a day (QID) | ORAL | Status: DC | PRN

## 2015-02-23 MED ORDER — ONDANSETRON HCL 4 MG/2ML IJ SOLN
4.0000 mg | Freq: Once | INTRAMUSCULAR | Status: AC
  Administered 2015-02-23: 4 mg via INTRAVENOUS
  Filled 2015-02-23: qty 2

## 2015-02-23 MED ORDER — TAMSULOSIN HCL 0.4 MG PO CAPS
0.4000 mg | ORAL_CAPSULE | ORAL | Status: AC
  Administered 2015-02-23: 0.4 mg via ORAL
  Filled 2015-02-23 (×2): qty 1

## 2015-02-23 MED ORDER — NAPROXEN 500 MG PO TABS: 500.0000 mg | ORAL_TABLET | Freq: Two times a day (BID) | ORAL | Status: DC

## 2015-02-23 MED ORDER — PHENAZOPYRIDINE HCL 200 MG PO TABS: 200.0000 mg | ORAL_TABLET | Freq: Three times a day (TID) | ORAL | Status: DC | PRN

## 2015-02-23 MED ORDER — TAMSULOSIN HCL 0.4 MG PO CAPS
0.4000 mg | ORAL_CAPSULE | Freq: Every day | ORAL | Status: DC
Start: 2015-02-23 — End: 2016-03-08

## 2015-02-23 MED ORDER — ONDANSETRON 8 MG PO TBDP: 8.0000 mg | ORAL_TABLET | Freq: Three times a day (TID) | ORAL | Status: DC | PRN

## 2015-02-23 MED ORDER — HYDROMORPHONE HCL 1 MG/ML IJ SOLN
1.0000 mg | Freq: Once | INTRAMUSCULAR | Status: AC
  Administered 2015-02-23: 1 mg via INTRAVENOUS
  Filled 2015-02-23: qty 1

## 2015-02-23 MED ORDER — KETOROLAC TROMETHAMINE 30 MG/ML IJ SOLN
30.0000 mg | Freq: Once | INTRAMUSCULAR | Status: AC
  Administered 2015-02-23: 30 mg via INTRAVENOUS
  Filled 2015-02-23: qty 1

## 2015-02-23 NOTE — ED Notes (Signed)
Pt reports right flank pain that started last night. Pt reports pain is progressively getting worse. Pt c/o NV and a pulling pain when she tries to urinate.

## 2015-02-23 NOTE — Discharge Instructions (Signed)
You were prescribed a medication that is potentially sedating. Do not drink alcohol, drive or participate in any other potentially dangerous activities while taking this medication as it may make you sleepy. Do not take this medication with any other sedating medications, either prescription or over-the-counter. If you were prescribed Percocet or Vicodin, do not take these with acetaminophen (Tylenol) as it is already contained within these medications.   Opioid pain medications (or "narcotics") can be habit forming.  Use it as little as possible to achieve adequate pain control.  Do not use or use it with extreme caution if you have a history of opiate abuse or dependence.  If you are on a pain contract with your primary care doctor or a pain specialist, be sure to let them know you were prescribed this medication today from the [facility] Emergency Department.  This medication is intended for your use only - do not give any to anyone else and keep it in a secure place where nobody else, especially children and pets, have access to it.  It will also cause or worsen constipation, so you may want to consider taking an over-the-counter stool softener while you are taking this medication.  Hematuria, Adult Hematuria is blood in your urine. It can be caused by a bladder infection, kidney infection, prostate infection, kidney stone, or cancer of your urinary tract. Infections can usually be treated with medicine, and a kidney stone usually will pass through your urine. If neither of these is the cause of your hematuria, further workup to find out the reason may be needed. It is very important that you tell your health care provider about any blood you see in your urine, even if the blood stops without treatment or happens without causing pain. Blood in your urine that happens and then stops and then happens again can be a symptom of a very serious condition. Also, pain is not a symptom in the initial stages  of many urinary cancers. HOME CARE INSTRUCTIONS   Drink lots of fluid, 3-4 quarts a day. If you have been diagnosed with an infection, cranberry juice is especially recommended, in addition to large amounts of water.  Avoid caffeine, tea, and carbonated beverages because they tend to irritate the bladder.  Avoid alcohol because it may irritate the prostate.  Take all medicines as directed by your health care provider.  If you were prescribed an antibiotic medicine, finish it all even if you start to feel better.  If you have been diagnosed with a kidney stone, follow your health care provider's instructions regarding straining your urine to catch the stone.  Empty your bladder often. Avoid holding urine for long periods of time.  After a bowel movement, women should cleanse front to back. Use each tissue only once.  Empty your bladder before and after sexual intercourse if you are a female. SEEK MEDICAL CARE IF:  You develop back pain.  You have a fever.  You have a feeling of sickness in your stomach (nausea) or vomiting.  Your symptoms are not better in 3 days. Return sooner if you are getting worse. SEEK IMMEDIATE MEDICAL CARE IF:   You develop severe vomiting and are unable to keep the medicine down.  You develop severe back or abdominal pain despite taking your medicines.  You begin passing a large amount of blood or clots in your urine.  You feel extremely weak or faint, or you pass out. MAKE SURE YOU:   Understand these instructions.  Will watch your condition.  Will get help right away if you are not doing well or get worse.   This information is not intended to replace advice given to you by your health care provider. Make sure you discuss any questions you have with your health care provider.   Document Released: 01/17/2005 Document Revised: 02/07/2014 Document Reviewed: 09/17/2012 Elsevier Interactive Patient Education 2016 [employer].  Hydronephrosis Hydronephrosis is the enlargement of a kidney due to a blockage that stops urine from flowing out of the body. CAUSES Common causes of this condition include:  A birth (congenital) defect of the kidney.  A congenital defect of the tube through which urine travels (ureter).  Kidney stones.  An enlarged prostate gland.  A tumor.  Cancer of the prostate, bladder, uterus, ovary, or colon.  A blood clot. SYMPTOMS Symptoms of this condition include:  Pain or discomfort in your side (flank).  Swelling of the abdomen.  Pain in the abdomen.  Nausea and vomiting.  Fever.  Pain while passing urine.  Feeling of urgency to urinate.  Frequent urination.  Infection of the urinary tract. In some cases, there are no symptoms. DIAGNOSIS This condition may be diagnosed with:  A medical history.  A physical exam.  Blood and urine tests to check kidney function.  Imaging tests, such as an X-ray, ultrasound, CT scan, or MRI.  A test in which a rigid or flexible telescope (cystoscope) is used to view the site of the blockage. TREATMENT Treatment for this condition depends on where the blockage is located, how long it has been there, and what caused it. The goal of treatment is to remove the blockage. Treatment options include:  A procedure to put in a soft tube to help drain urine.  Antibiotic medicines to treat or prevent infection.  Shock-wave therapy (lithotripsy) to help eliminate kidney stones. HOME CARE INSTRUCTIONS  Get lots of rest.  Drink enough fluid to keep your urine clear or pale yellow.  If you have a drain in, follow your health care provider's instructions about how to care for it.  Take medicines only as directed by your health care provider.  If you were prescribed an antibiotic medicine, finish all of it even if you start to feel better.  Keep all follow-up visits as directed by your health care provider. This is  important. SEEK MEDICAL CARE IF:  You continue to have symptoms after treatment.  You develop new symptoms.  You have a problem with a drainage device.  Your urine becomes cloudy or bloody.  You have a fever. SEEK IMMEDIATE MEDICAL CARE IF:  You have severe flank or abdominal pain.  You develop vomiting and are unable to keep fluids down.   This information is not intended to replace advice given to you by your health care provider. Make sure you discuss any questions you have with your health care provider.   Document Released: 11/14/2006 Document Revised: 06/03/2014 Document Reviewed: 01/13/2014 Elsevier Interactive Patient Education 2016 [employer]  Kidney Stones Kidney stones (urolithiasis) are deposits that form inside your kidneys. The intense pain is caused by the stone moving through the urinary tract. When the stone moves, the ureter goes into spasm around the stone. The stone is usually passed in the urine.  CAUSES   A disorder that makes certain neck glands produce too much parathyroid hormone (primary hyperparathyroidism).  A buildup of uric acid crystals, similar to gout in your joints.  Narrowing (stricture) of the ureter.  A kidney obstruction present at birth (congenital obstruction).  Previous surgery on the kidney or ureters.  Numerous kidney infections. SYMPTOMS   Feeling sick to your stomach (nauseous).  Throwing up (vomiting).  Blood in the urine (hematuria).  Pain that usually spreads (radiates) to the groin.  Frequency or urgency of urination. DIAGNOSIS   Taking a history and physical exam.  Blood or urine tests.  CT scan.  Occasionally, an examination of the inside of the urinary bladder (cystoscopy) is performed. TREATMENT   Observation.  Increasing your fluid intake.  Extracorporeal shock wave lithotripsy--This is a noninvasive procedure that uses shock waves to break up kidney stones.  Surgery may be needed if you have  severe pain or persistent obstruction. There are various surgical procedures. Most of the procedures are performed with the use of small instruments. Only small incisions are needed to accommodate these instruments, so recovery time is minimized. The size, location, and chemical composition are all important variables that will determine the proper choice of action for you. Talk to your health care provider to better understand your situation so that you will minimize the risk of injury to yourself and your kidney.  HOME CARE INSTRUCTIONS   Drink enough water and fluids to keep your urine clear or pale yellow. This will help you to pass the stone or stone fragments.  Strain all urine through the provided strainer. Keep all particulate matter and stones for your health care provider to see. The stone causing the pain may be as small as a grain of salt. It is very important to use the strainer each and every time you pass your urine. The collection of your stone will allow your health care provider to analyze it and verify that a stone has actually passed. The stone analysis will often identify what you can do to reduce the incidence of recurrences.  Only take over-the-counter or prescription medicines for pain, discomfort, or fever as directed by your health care provider.  Keep all follow-up visits as told by your health care provider. This is important.  Get follow-up X-rays if required. The absence of pain does not always mean that the stone has passed. It may have only stopped moving. If the urine remains completely obstructed, it can cause loss of kidney function or even complete destruction of the kidney. It is your responsibility to make sure X-rays and follow-ups are completed. Ultrasounds of the kidney can show blockages and the status of the kidney. Ultrasounds are not associated with any radiation and can be performed easily in a matter of minutes.  Make changes to your daily diet as told by  your health care provider. You may be told to:  Limit the amount of salt that you eat.  Eat 5 or more servings of fruits and vegetables each day.  Limit the amount of meat, poultry, fish, and eggs that you eat.  Collect a 24-hour urine sample as told by your health care provider.You may need to collect another urine sample every 6-12 months. SEEK MEDICAL CARE IF:  You experience pain that is progressive and unresponsive to any pain medicine you have been prescribed. SEEK IMMEDIATE MEDICAL CARE IF:   Pain cannot be controlled with the prescribed medicine.  You have a fever or shaking chills.  The severity or intensity of pain increases over 18 hours and is not relieved by pain medicine.  You develop a new onset of abdominal pain.  You feel faint or pass out.  You are  unable to urinate.   This information is not intended to replace advice given to you by your health care provider. Make sure you discuss any questions you have with your health care provider.   Document Released: 01/17/2005 Document Revised: 10/08/2014 Document Reviewed: 06/20/2012 Elsevier Interactive Patient Education [employer]

## 2015-02-23 NOTE — ED Notes (Signed)
Pt presents with right flank pain since last night. States she has increased urinary frequency but denies pain upon urination. Pt is writhing around the stretcher in pain and asking for pain medications. Pt alert & oriented.

## 2015-02-23 NOTE — ED Notes (Signed)
Pt discharged home after verbalizing understanding of discharge instructions; nad noted. 

## 2015-02-23 NOTE — ED Provider Notes (Signed)
[facility] Emergency Department Provider Note  ____________________________________________  Time seen: 11:15 AM  I have reviewed the triage vital signs and the nursing notes.   HISTORY  Chief Complaint Flank Pain    HPI Alyssa Zuniga is a 50 y.o. female who complains of right flank pain since last night. It radiates around to the right lower quadrant and suprapubic area, waxing and waning and colicky, severe at its worse. Associated with nausea and vomiting but no change in bowel habits. No fevers or chills. No dysuria .     Past Medical History  Diagnosis Date  . Hypertension   . Thyroid disease   . Fibromyalgia   . Chronic back pain   . Migraines   . IBS (irritable bowel syndrome)   . Depression   . Anxiety   . Headache, common migraine   . Chronic pain   . Occasional tremors      Patient Active Problem List   Diagnosis Date Noted  . Tremors of nervous system 01/14/2015  . Incontinence of bowel 01/14/2015  . Incontinence of urine 01/14/2015  . Fibromyalgia 12/15/2014  . Chronic pain 12/15/2014  . Vitamin D deficiency 12/15/2014  . Frozen shoulder 12/15/2014  . Hyperemesis 12/15/2014  . Hypothyroidism 11/05/2014  . Diaphoresis 11/05/2014  . Memory loss 11/05/2014  . Hypercholesteremia 11/05/2014  . Migraines 11/05/2014  . Insomnia 11/05/2014     Past Surgical History  Procedure Laterality Date  . Abdominal hysterectomy    . Cesarean section    . Bunion removal Right   . Cardiac catheterization  07/05/14     Current Outpatient Rx  Name  Route  Sig  Dispense  Refill  . amitriptyline (ELAVIL) 10 MG tablet   Oral   Take 1 tablet (10 mg total) by mouth daily as needed.   90 tablet   3   . atorvastatin (LIPITOR) 40 MG tablet   Oral   Take 1 tablet (40 mg total) by mouth daily.   90 tablet   3   . azithromycin (ZITHROMAX Z-PAK) 250 MG tablet      Take 2 tablets first day and then 1 po a day for 4 days   6 tablet   0    . butalbital-acetaminophen-caffeine (FIORICET) 50-325-40 MG tablet   Oral   Take 1-2 tablets by mouth every 6 (six) hours as needed for headache.   20 tablet   0   . calcium carbonate (TUMS EX) 750 MG chewable tablet   Oral   Chew 1 tablet by mouth daily.         Marland Kitchen estradiol (ESTRACE) 1 MG tablet   Oral   Take 1 tablet (1 mg total) by mouth daily.   30 tablet   0   . levothyroxine (SYNTHROID, LEVOTHROID) 112 MCG tablet   Oral   Take 1 tablet (112 mcg total) by mouth daily.   90 tablet   3   . Multiple Vitamins-Minerals (MULTI + OMEGA-3 ADULT GUMMIES PO)   Oral   Take by mouth daily.         . naproxen (NAPROSYN) 500 MG tablet   Oral   Take 1 tablet (500 mg total) by mouth 2 (two) times daily with a meal.   20 tablet   0   . omeprazole (PRILOSEC) 40 MG capsule   Oral   Take 1 capsule (40 mg total) by mouth daily.   90 capsule   3   . ondansetron (  ZOFRAN ODT) 8 MG disintegrating tablet   Oral   Take 1 tablet (8 mg total) by mouth every 8 (eight) hours as needed for nausea or vomiting.   20 tablet   0   . oxyCODONE (OXYCONTIN) 20 mg 12 hr tablet   Oral   Take 20 mg by mouth every 12 (twelve) hours.         . Oxycodone HCl 10 MG TABS   Oral   Take 10 mg by mouth 3 (three) times daily as needed.         Marland Kitchen oxyCODONE-acetaminophen (ROXICET) 5-325 MG tablet   Oral   Take 1 tablet by mouth every 6 (six) hours as needed for severe pain.   12 tablet   0   . phenazopyridine (PYRIDIUM) 200 MG tablet   Oral   Take 1 tablet (200 mg total) by mouth 3 (three) times daily as needed for pain.   10 tablet   0   . potassium chloride SA (K-DUR,KLOR-CON) 20 MEQ tablet   Oral   Take 1 tablet (20 mEq total) by mouth daily.   90 tablet   3   . pregabalin (LYRICA) 100 MG capsule   Oral   Take 100 mg by mouth 3 (three) times daily.         . tamsulosin (FLOMAX) 0.4 MG CAPS capsule   Oral   Take 1 capsule (0.4 mg total) by mouth daily.   30 capsule   0    . topiramate (TOPAMAX) 50 MG tablet   Oral   Take 1 tablet (50 mg total) by mouth 2 (two) times daily.   180 tablet   3   . topiramate (TOPAMAX) 50 MG tablet   Oral   Take 1 tablet (50 mg total) by mouth 2 (two) times daily.   60 tablet   0   . Zinc 25 MG TABS   Oral   Take 25 mg by mouth daily.            Allergies Penicillins and Sulfa antibiotics   Family History  Problem Relation Age of Onset  . Depression Mother   . Anxiety disorder Mother   . Cancer Mother     breast  . Migraines Mother   . Stroke Father   . Heart disease Father     MI  . Alcohol abuse Father   . Arthritis Father   . Migraines Father   . Thyroid disease Father   . Thyroid disease Maternal Grandmother   . Hyperlipidemia Maternal Grandmother   . Migraines Brother   . Anxiety disorder Brother   . Heart disease Maternal Grandfather     MI  . Hyperlipidemia Paternal Grandmother   . Thyroid disease Paternal Grandmother   . Heart disease Paternal Grandfather     MI  . Migraines Daughter   . Migraines Son   . Migraines Son   . Migraines Daughter     Social History Social History  Substance Use Topics  . Smoking status: Never Smoker   . Smokeless tobacco: Never Used  . Alcohol Use: No    Review of Systems  Constitutional:   No fever or chills. No weight changes Eyes:   No blurry vision or double vision.  ENT:   No sore throat. Cardiovascular:   No chest pain. Respiratory:   No dyspnea or cough. Gastrointestinal:   Positive right sided pain with vomiting. No diarrhea.  No BRBPR or melena. Genitourinary:   Negative  for dysuria, urinary retention, bloody urine, or difficulty urinating. Musculoskeletal:   Negative for back pain. No joint swelling or pain. Positive right flank pain Skin:   Negative for rash. Neurological:   Negative for headaches, focal weakness or numbness. Psychiatric:  No anxiety or depression.   Endocrine:  No hot/cold intolerance, changes in energy, or  sleep difficulty.  10-point ROS otherwise negative.  ____________________________________________   PHYSICAL EXAM:  VITAL SIGNS: ED Triage Vitals  Enc Vitals Group     BP 02/23/15 0952 166/108 mmHg     Pulse Rate 02/23/15 0952 100     Resp 02/23/15 0952 18     Temp 02/23/15 0952 98.6 F (37 C)     Temp Source 02/23/15 0952 Oral     SpO2 02/23/15 0952 98 %     Weight 02/23/15 0952 135 lb (61.236 kg)     Height 02/23/15 0952  (1.575 m)     Head Cir --      Peak Flow --      Pain Score 02/23/15 0956 9     Pain Loc --      Pain Edu? --      Excl. in GC? --     Vital signs reviewed, nursing assessments reviewed.   Constitutional:   Alert and oriented. Mild distress due to pain Eyes:   No scleral icterus. No conjunctival pallor. PERRL. EOMI ENT   Head:   Normocephalic and atraumatic.   Nose:   No congestion/rhinnorhea. No septal hematoma   Mouth/Throat:   MMM, no pharyngeal erythema. No peritonsillar mass. No uvula shift.   Neck:   No stridor. No SubQ emphysema. No meningismus. Hematological/Lymphatic/Immunilogical:   No cervical lymphadenopathy. Cardiovascular:   RRR. Normal and symmetric distal pulses are present in all extremities. No murmurs, rubs, or gallops. Respiratory:   Normal respiratory effort without tachypnea nor retractions. Breath sounds are clear and equal bilaterally. No wheezes/rales/rhonchi. Gastrointestinal:   Soft with suprapubic and right lower quadrant tenderness.. No distention. There is no CVA tenderness.  No rebound, rigidity, or guarding. Genitourinary:   deferred Musculoskeletal:   Nontender with normal range of motion in all extremities. No joint effusions.  No lower extremity tenderness.  No edema. Neurologic:   Normal speech and language.  CN 2-10 normal. Motor grossly intact. No pronator drift.  Normal gait. No gross focal neurologic deficits are appreciated.  Skin:    Skin is warm, dry and intact. No rash noted.  No  petechiae, purpura, or bullae. Psychiatric:   Mood and affect are normal. Speech and behavior are normal. Patient exhibits appropriate insight and judgment.  ____________________________________________    LABS (pertinent positives/negatives) (all labs ordered are listed, but only abnormal results are displayed) Labs Reviewed  COMPREHENSIVE METABOLIC PANEL - Abnormal; Notable for the following:    Glucose, Bld 123 (*)    Total Protein 8.6 (*)    All other components within normal limits  CBC - Abnormal; Notable for the following:    WBC 14.4 (*)    Platelets 485 (*)    All other components within normal limits  URINALYSIS COMPLETEWITH MICROSCOPIC ([facility] ONLY) - Abnormal; Notable for the following:    Color, Urine YELLOW (*)    APPearance CLEAR (*)    Bacteria, UA RARE (*)    Squamous Epithelial / LPF 0-5 (*)    All other components within normal limits  LIPASE, BLOOD   ____________________________________________   EKG    ____________________________________________    RADIOLOGY  CT abdomen and pelvis reveals a 3 mm stone in the right ureter at the UVJ with associated hydroureteronephrosis. No inflammatory changes  ____________________________________________   PROCEDURES   ____________________________________________   INITIAL IMPRESSION / ASSESSMENT AND PLAN / ED COURSE  Pertinent labs & imaging results that were available during my care of the patient were reviewed by me and considered in my medical decision making (see chart for details).  Patient presents with symptoms and exam consistent with renal colic. We'll check labs and urinalysis. With the severity of her pain will also get a CT scan since she has not had a history of kidney stones before. Does not appear to be septic at this time.  ----------------------------------------- 1:02 PM on 02/23/2015 -----------------------------------------  Labs unremarkable, there is mild hematuria but absolutely  no evidence of urinary tract infection. She does have a mildly elevated white blood cell count of 14,000 which I think is due to the stress of an severe pain that she is experiencing. Renal function is preserved. She overall has a small stone that is very distal and likely to pass to the bladder very soon. We will provide symptomatically management at this time and follow up with primary care, follow-up with urology if symptoms do not resolve within the next week. No evidence of pyelonephritis or ureteral/kidney abscess.    ____________________________________________   FINAL CLINICAL IMPRESSION(S) / ED DIAGNOSES  Final diagnoses:  Hematuria  Hydronephrosis with ureteropelvic junction (UPJ) obstruction  Ureterolithiasis      Sharman Cheek, MD 02/23/15 1303

## 2015-02-23 NOTE — ED Notes (Signed)
Pt's keys given to officer on duty at lobby desk until pt's ride arrives.

## 2015-04-21 ENCOUNTER — Encounter: Payer: Self-pay | Admitting: Unknown Physician Specialty

## 2015-04-21 ENCOUNTER — Ambulatory Visit (INDEPENDENT_AMBULATORY_CARE_PROVIDER_SITE_OTHER): Payer: BLUE CROSS/BLUE SHIELD | Admitting: Unknown Physician Specialty

## 2015-04-21 VITALS — BP 164/113 | HR 87 | Temp 98.3°F | Ht 61.2 in | Wt 133.6 lb

## 2015-04-21 DIAGNOSIS — N200 Calculus of kidney: Secondary | ICD-10-CM

## 2015-04-21 DIAGNOSIS — I10 Essential (primary) hypertension: Secondary | ICD-10-CM | POA: Diagnosis not present

## 2015-04-21 DIAGNOSIS — M549 Dorsalgia, unspecified: Secondary | ICD-10-CM

## 2015-04-21 DIAGNOSIS — G8929 Other chronic pain: Secondary | ICD-10-CM | POA: Diagnosis not present

## 2015-04-21 DIAGNOSIS — R159 Full incontinence of feces: Secondary | ICD-10-CM

## 2015-04-21 DIAGNOSIS — R7689 Other specified abnormal immunological findings in serum: Secondary | ICD-10-CM

## 2015-04-21 DIAGNOSIS — F32A Depression, unspecified: Secondary | ICD-10-CM | POA: Insufficient documentation

## 2015-04-21 DIAGNOSIS — D72829 Elevated white blood cell count, unspecified: Secondary | ICD-10-CM

## 2015-04-21 DIAGNOSIS — F329 Major depressive disorder, single episode, unspecified: Secondary | ICD-10-CM | POA: Diagnosis not present

## 2015-04-21 DIAGNOSIS — R768 Other specified abnormal immunological findings in serum: Secondary | ICD-10-CM | POA: Diagnosis not present

## 2015-04-21 MED ORDER — HYDROCHLOROTHIAZIDE 25 MG PO TABS: 25.0000 mg | ORAL_TABLET | Freq: Every day | ORAL | Status: DC

## 2015-04-21 NOTE — Assessment & Plan Note (Signed)
Start HCTZ.  Recheck in 1 month

## 2015-04-21 NOTE — Assessment & Plan Note (Signed)
Needs to see a counselor for disability.  [facility]

## 2015-04-21 NOTE — Progress Notes (Signed)
BP 164/113 mmHg  Pulse 87  Temp(Src) 98.3 F (36.8 C)  Ht 5' 1.2" (1.554 m)  Wt 133 lb 9.6 oz (60.601 kg)  BMI 25.09 kg/m2  SpO2 99%  LMP  (LMP Unknown)   Subjective:    Patient ID: Alyssa Zuniga, female    DOB: [birth year], 50 y.o.   MRN: [id number]  HPI: Alyssa CrowLinda G Glassberg is a 50 y.o. female  Chief Complaint  Patient presents with  . Hyperlipidemia  . Hypothyroidism  . Fibromyalgia  . Referral    pt states she has a lot of referrals she would like   Hypertension Not taking any BP meds.  She hasn't been on one for a while.   Average home BPs Not checking   No problems or lightheadedness No chest pain with exertion or shortness of breath No Edema   Hyperlipidemia Using medications without problems  No Muscle aches Yes with fibromyalgia Diet compliance: good Exercise: none  Kidney stones Was in the hospital for kidney stones.  Wonders why she gets so many.    ANA Elevated with multiple pain issues.  She still hasn't seen rheumatology.  Review of those labs show elevated WBC.  Depression  Needs a counselor  Chronic pain Through pain management.  Needs Aquatherapy and using TENS  Needs referrals for rheumatology, Gastroenterology for stomach issues, neurosurgeon for chronic back that was set up with last doctor, urology, and aquatherapy.     Relevant past medical, surgical, family and social history reviewed and updated as indicated. Interim medical history since our last visit reviewed. Allergies and medications reviewed and updated.  Review of Systems  Gastrointestinal: Positive for nausea and diarrhea.  and vomiting.    Per HPI unless specifically indicated above     Objective:    BP 164/113 mmHg  Pulse 87  Temp(Src) 98.3 F (36.8 C)  Ht 5' 1.2" (1.554 m)  Wt 133 lb 9.6 oz (60.601 kg)  BMI 25.09 kg/m2  SpO2 99%  LMP  (LMP Unknown)  Wt Readings from Last 3 Encounters:  04/21/15 133 lb 9.6 oz (60.601 kg)  02/23/15 135 lb (61.236 kg)  02/05/15  135 lb (61.236 kg)    Physical Exam  Constitutional: She is oriented to person, place, and time. She appears well-developed and well-nourished. No distress.  HENT:  Head: Normocephalic and atraumatic.  Eyes: Conjunctivae and lids are normal. Right eye exhibits no discharge. Left eye exhibits no discharge. No scleral icterus.  Neck: Normal range of motion. Neck supple. No JVD present. Carotid bruit is not present.  Cardiovascular: Normal rate, regular rhythm and normal heart sounds.   Pulmonary/Chest: Effort normal and breath sounds normal.  Abdominal: Normal appearance. There is no splenomegaly or hepatomegaly.  Musculoskeletal: Normal range of motion.  Neurological: She is alert and oriented to person, place, and time.  Skin: Skin is warm, dry and intact. No rash noted. No pallor.  Psychiatric: She has a normal mood and affect. Her behavior is normal. Judgment and thought content normal.    Results for orders placed or performed during the hospital encounter of 02/23/15  Lipase, blood  Result Value Ref Range   Lipase 26 11 - 51 U/L  Comprehensive metabolic panel  Result Value Ref Range   Sodium 143 135 - 145 mmol/L   Potassium 3.7 3.5 - 5.1 mmol/L   Chloride 109 101 - 111 mmol/L   CO2 24 22 - 32 mmol/L   Glucose, Bld 123 (H) 65 - 99 mg/dL  BUN 11 6 - 20 mg/dL   Creatinine, Ser 1.61 0.44 - 1.00 mg/dL   Calcium 09.6 8.9 - 04.5 mg/dL   Total Protein 8.6 (H) 6.5 - 8.1 g/dL   Albumin 4.2 3.5 - 5.0 g/dL   AST 24 15 - 41 U/L   ALT 31 14 - 54 U/L   Alkaline Phosphatase 77 38 - 126 U/L   Total Bilirubin 0.9 0.3 - 1.2 mg/dL   GFR calc non Af Amer >60 >60 mL/min   GFR calc Af Amer >60 >60 mL/min   Anion gap 10 5 - 15  CBC  Result Value Ref Range   WBC 14.4 (H) 3.6 - 11.0 K/uL   RBC 4.86 3.80 - 5.20 MIL/uL   Hemoglobin 14.8 12.0 - 16.0 g/dL   HCT 40.9 81.1 - 91.4 %   MCV 88.9 80.0 - 100.0 fL   MCH 30.4 26.0 - 34.0 pg   MCHC 34.2 32.0 - 36.0 g/dL   RDW 78.2 95.6 - 21.3 %    Platelets 485 (H) 150 - 440 K/uL  Urinalysis complete, with microscopic- may I&O cath if menses ([facility] only)  Result Value Ref Range   Color, Urine YELLOW (A) YELLOW   APPearance CLEAR (A) CLEAR   Glucose, UA NEGATIVE NEGATIVE mg/dL   Bilirubin Urine NEGATIVE NEGATIVE   Ketones, ur NEGATIVE NEGATIVE mg/dL   Specific Gravity, Urine 1.015 1.005 - 1.030   Hgb urine dipstick NEGATIVE NEGATIVE   pH 5.0 5.0 - 8.0   Protein, ur NEGATIVE NEGATIVE mg/dL   Nitrite NEGATIVE NEGATIVE   Leukocytes, UA NEGATIVE NEGATIVE   RBC / HPF 6-30 0 - 5 RBC/hpf   WBC, UA 0-5 0 - 5 WBC/hpf   Bacteria, UA RARE (A) NONE SEEN   Squamous Epithelial / LPF 0-5 (A) NONE SEEN   Mucous PRESENT    Hyaline Casts, UA PRESENT       Assessment & Plan:   Problem List Items Addressed This Visit      Unprioritized   Incontinence of bowel   Relevant Orders   Ambulatory referral to Gastroenterology   Hypertension    Start HCTZ.  Recheck in 1 month      Relevant Medications   hydrochlorothiazide (HYDRODIURIL) 25 MG tablet   Other Relevant Orders   Comprehensive metabolic panel   Depression    Needs to see a counselor for disability.  Refer to [employer]       Other Visit Diagnoses    Elevated antinuclear antibody (ANA) level    -  Primary    Relevant Orders    Ambulatory referral to Rheumatology    ANA w/Reflex    Chronic back pain        Relevant Orders    Ambulatory referral to Neurosurgery    ANA w/Reflex    Kidney stones        Relevant Medications    hydrochlorothiazide (HYDRODIURIL) 25 MG tablet    Other Relevant Orders    Ambulatory referral to Urology    Elevated WBC count        Relevant Orders    CBC with Differential/Platelet        Follow up plan: Return in about 4 weeks (around 05/19/2015).

## 2015-04-22 ENCOUNTER — Encounter: Payer: Self-pay | Admitting: Unknown Physician Specialty

## 2015-04-22 LAB — COMPREHENSIVE METABOLIC PANEL
A/G RATIO: 1.4 (ref 1.2–2.2)
ALT: 43 IU/L — AB (ref 0–32)
AST: 24 IU/L (ref 0–40)
Albumin: 4.5 g/dL (ref 3.5–5.5)
Alkaline Phosphatase: 103 IU/L (ref 39–117)
BILIRUBIN TOTAL: 0.3 mg/dL (ref 0.0–1.2)
BUN/Creatinine Ratio: 10 (ref 9–23)
BUN: 8 mg/dL (ref 6–24)
CALCIUM: 9.5 mg/dL (ref 8.7–10.2)
CHLORIDE: 102 mmol/L (ref 96–106)
CO2: 23 mmol/L (ref 18–29)
Creatinine, Ser: 0.78 mg/dL (ref 0.57–1.00)
GFR calc Af Amer: 104 mL/min/{1.73_m2} (ref >59)
GFR calc non Af Amer: 90 mL/min/{1.73_m2} (ref >59)
GLUCOSE: 87 mg/dL (ref 65–99)
Globulin, Total: 3.2 g/dL (ref 1.5–4.5)
POTASSIUM: 3.7 mmol/L (ref 3.5–5.2)
Sodium: 143 mmol/L (ref 134–144)
Total Protein: 7.7 g/dL (ref 6.0–8.5)

## 2015-04-22 LAB — CBC WITH DIFFERENTIAL/PLATELET
BASOS ABS: 0.1 10*3/uL (ref 0.0–0.2)
Basos: 1 %
EOS (ABSOLUTE): 0.4 10*3/uL (ref 0.0–0.4)
Eos: 4 %
Hematocrit: 40.5 % (ref 34.0–46.6)
Hemoglobin: 13.4 g/dL (ref 11.1–15.9)
IMMATURE GRANS (ABS): 0 10*3/uL (ref 0.0–0.1)
IMMATURE GRANULOCYTES: 0 %
LYMPHS: 41 %
Lymphocytes Absolute: 4.2 10*3/uL — ABNORMAL HIGH (ref 0.7–3.1)
MCH: 30.7 pg (ref 26.6–33.0)
MCHC: 33.1 g/dL (ref 31.5–35.7)
MCV: 93 fL (ref 79–97)
Monocytes Absolute: 0.6 10*3/uL (ref 0.1–0.9)
Monocytes: 6 %
NEUTROS PCT: 48 %
Neutrophils Absolute: 4.9 10*3/uL (ref 1.4–7.0)
PLATELETS: 419 10*3/uL — AB (ref 150–379)
RBC: 4.36 x10E6/uL (ref 3.77–5.28)
RDW: 14.9 % (ref 12.3–15.4)
WBC: 10.1 10*3/uL (ref 3.4–10.8)

## 2015-04-22 LAB — ANA W/REFLEX: ANA: NEGATIVE

## 2015-05-04 ENCOUNTER — Telehealth: Payer: Self-pay | Admitting: Unknown Physician Specialty

## 2015-05-04 MED ORDER — PROMETHAZINE HCL 25 MG PO TABS: 25.0000 mg | ORAL_TABLET | Freq: Three times a day (TID) | ORAL | Status: DC | PRN

## 2015-05-04 NOTE — Telephone Encounter (Signed)
Routing to provider  

## 2015-05-04 NOTE — Telephone Encounter (Signed)
ondansetron (ZOFRAN ODT) 8 MG disintegrating tablet Pharmacy: [facility] [id number] - [place], [place] - [address] [place] [address] [place] [address]  Patient called stating that her medication above is not working and wants to know if Elnita MaxwellCheryl can call her in something else. Thanks.

## 2015-05-08 ENCOUNTER — Encounter: Payer: Self-pay | Admitting: Unknown Physician Specialty

## 2015-05-08 ENCOUNTER — Ambulatory Visit (INDEPENDENT_AMBULATORY_CARE_PROVIDER_SITE_OTHER): Payer: BLUE CROSS/BLUE SHIELD | Admitting: Unknown Physician Specialty

## 2015-05-08 VITALS — BP 130/79 | HR 69 | Temp 98.2°F | Wt 135.0 lb

## 2015-05-08 DIAGNOSIS — R5382 Chronic fatigue, unspecified: Secondary | ICD-10-CM | POA: Diagnosis not present

## 2015-05-08 DIAGNOSIS — L509 Urticaria, unspecified: Secondary | ICD-10-CM

## 2015-05-08 DIAGNOSIS — R519 Headache, unspecified: Secondary | ICD-10-CM

## 2015-05-08 DIAGNOSIS — R51 Headache: Secondary | ICD-10-CM | POA: Diagnosis not present

## 2015-05-08 DIAGNOSIS — R110 Nausea: Secondary | ICD-10-CM

## 2015-05-08 DIAGNOSIS — G8929 Other chronic pain: Secondary | ICD-10-CM

## 2015-05-08 NOTE — Progress Notes (Signed)
BP 130/79 mmHg  Pulse 69  Temp(Src) 98.2 F (36.8 C)  Wt 135 lb (61.236 kg)  SpO2 95%  LMP  (LMP Unknown)   Subjective:    Patient ID: Alyssa Zuniga, female    DOB: 11-Jun-1965, 50 y.o.   MRN: [id number]  HPI: Alyssa Zuniga is a 50 y.o. female  Chief Complaint  Patient presents with  . Sick    headaches, nausea, fatigue, sweating, itching, some SOB   2 weeks pt has had the above symptoms that are ongoing for a while but worse in the last 2 weeks.  We started her on HCTZ last visit and it seems to correlate with her symptoms.  She stated she has taken it before and it didn't bother her.  She is wondering if a B12   Relevant past medical, surgical, family and social history reviewed and updated as indicated. Interim medical history since our last visit reviewed. Allergies and medications reviewed and updated.  Review of Systems  Per HPI unless specifically indicated above     Objective:    BP 130/79 mmHg  Pulse 69  Temp(Src) 98.2 F (36.8 C)  Wt 135 lb (61.236 kg)  SpO2 95%  LMP  (LMP Unknown)  Wt Readings from Last 3 Encounters:  05/08/15 135 lb (61.236 kg)  04/21/15 133 lb 9.6 oz (60.601 kg)  02/23/15 135 lb (61.236 kg)    Physical Exam  Constitutional: She is oriented to person, place, and time. She appears well-developed and well-nourished. No distress.  HENT:  Head: Normocephalic and atraumatic.  Eyes: Conjunctivae and lids are normal. Right eye exhibits no discharge. Left eye exhibits no discharge. No scleral icterus.  Cardiovascular: Normal rate.   Pulmonary/Chest: Effort normal.  Abdominal: Normal appearance. There is no splenomegaly or hepatomegaly.  Musculoskeletal: Normal range of motion.  Neurological: She is alert and oriented to person, place, and time.  Skin: Skin is intact. No rash noted. No pallor.  Psychiatric: She has a normal mood and affect. Her behavior is normal. Judgment and thought content normal.    Results for orders placed or  performed in visit on 04/21/15  ANA w/Reflex  Result Value Ref Range   Anit Nuclear Antibody(ANA) Negative Negative  Comprehensive metabolic panel  Result Value Ref Range   Glucose 87 65 - 99 mg/dL   BUN 8 6 - 24 mg/dL   Creatinine, Ser 4.09 0.57 - 1.00 mg/dL   GFR calc non Af Amer 90 >59 mL/min/1.73   GFR calc Af Amer 104 >59 mL/min/1.73   BUN/Creatinine Ratio 10 9 - 23   Sodium 143 134 - 144 mmol/L   Potassium 3.7 3.5 - 5.2 mmol/L   Chloride 102 96 - 106 mmol/L   CO2 23 18 - 29 mmol/L   Calcium 9.5 8.7 - 10.2 mg/dL   Total Protein 7.7 6.0 - 8.5 g/dL   Albumin 4.5 3.5 - 5.5 g/dL   Globulin, Total 3.2 1.5 - 4.5 g/dL   Albumin/Globulin Ratio 1.4 1.2 - 2.2   Bilirubin Total 0.3 0.0 - 1.2 mg/dL   Alkaline Phosphatase 103 39 - 117 IU/L   AST 24 0 - 40 IU/L   ALT 43 (H) 0 - 32 IU/L  CBC with Differential/Platelet  Result Value Ref Range   WBC 10.1 3.4 - 10.8 x10E3/uL   RBC 4.36 3.77 - 5.28 x10E6/uL   Hemoglobin 13.4 11.1 - 15.9 g/dL   Hematocrit 81.1 91.4 - 46.6 %   MCV 93 79 -  97 fL   MCH 30.7 26.6 - 33.0 pg   MCHC 33.1 31.5 - 35.7 g/dL   RDW 84.614.9 96.212.3 - 95.215.4 %   Platelets 419 (H) 150 - 379 x10E3/uL   Neutrophils 48 %   Lymphs 41 %   Monocytes 6 %   Eos 4 %   Basos 1 %   Neutrophils Absolute 4.9 1.4 - 7.0 x10E3/uL   Lymphocytes Absolute 4.2 (H) 0.7 - 3.1 x10E3/uL   Monocytes Absolute 0.6 0.1 - 0.9 x10E3/uL   EOS (ABSOLUTE) 0.4 0.0 - 0.4 x10E3/uL   Basophils Absolute 0.1 0.0 - 0.2 x10E3/uL   Immature Granulocytes 0 %   Immature Grans (Abs) 0.0 0.0 - 0.1 x10E3/uL   Hematology Comments: Note:       Assessment & Plan:   Problem List Items Addressed This Visit    None    Visit Diagnoses    Urticaria    -  Primary    Chronic fatigue        Chronic nonintractable headache, unspecified headache type        Nausea           ? If this is related to HCTZ.  Will discontinue this, recheck on Monday, and consider B12 at pt request.    Follow up plan: Return for On  Monday.

## 2015-05-11 ENCOUNTER — Ambulatory Visit: Payer: BLUE CROSS/BLUE SHIELD | Admitting: Unknown Physician Specialty

## 2015-05-20 ENCOUNTER — Ambulatory Visit: Payer: BLUE CROSS/BLUE SHIELD | Admitting: Unknown Physician Specialty

## 2015-05-28 ENCOUNTER — Ambulatory Visit (INDEPENDENT_AMBULATORY_CARE_PROVIDER_SITE_OTHER): Payer: BLUE CROSS/BLUE SHIELD | Admitting: Gastroenterology

## 2015-05-28 ENCOUNTER — Encounter: Payer: Self-pay | Admitting: Gastroenterology

## 2015-05-28 ENCOUNTER — Other Ambulatory Visit: Payer: Self-pay

## 2015-05-28 VITALS — BP 133/99 | HR 102 | Temp 97.6°F | Ht 61.0 in | Wt 133.0 lb

## 2015-05-28 DIAGNOSIS — G43A0 Cyclical vomiting, not intractable: Secondary | ICD-10-CM | POA: Diagnosis not present

## 2015-05-28 DIAGNOSIS — K921 Melena: Secondary | ICD-10-CM | POA: Diagnosis not present

## 2015-05-28 DIAGNOSIS — R197 Diarrhea, unspecified: Secondary | ICD-10-CM | POA: Diagnosis not present

## 2015-05-28 DIAGNOSIS — M1990 Unspecified osteoarthritis, unspecified site: Secondary | ICD-10-CM | POA: Insufficient documentation

## 2015-05-28 DIAGNOSIS — K589 Irritable bowel syndrome without diarrhea: Secondary | ICD-10-CM | POA: Insufficient documentation

## 2015-05-28 NOTE — Progress Notes (Signed)
Gastroenterology Consultation  Referring Provider:     Gabriel CirriWicker, Cheryl, NP Primary Care Physician:  Gabriel Cirriheryl Wicker, NP Primary Gastroenterologist:  Dr. Servando SnareWohl     Reason for Consultation:     Incontinence        HPI:   Alyssa Zuniga is a 50 y.o. y/o female referred for consultation & management of Stool incontinence by Dr. Gabriel Cirriheryl Wicker, NP.  This patient comes in today with a report of diarrhea for many years. The patient states that this is been going on for at least 2 years. The patient now states that she has episodes of diarrhea that last a few days and then she'll go to just normal soft stools without diarrhea. The patient states that she has had accidents from her diarrhea. The patient also reports that she has been having nausea. Patient denies any unexplained weight loss. She reports that she has a history of irritable bowel syndrome for many years. The patient is not being helped with her omeprazole. She does report that she had a episode last week of rectal bleeding. She states that the blood was bright red and that her husband in an intimate surgeon stated that maybe she is having bleeding ulcers. There is no report of any hematemesis.  Past Medical History  Diagnosis Date  . Hypertension   . Thyroid disease   . Fibromyalgia   . Chronic back pain   . Migraines   . IBS (irritable bowel syndrome)   . Depression   . Anxiety   . Headache, common migraine   . Chronic pain   . Occasional tremors   . Kidney stones     Past Surgical History  Procedure Laterality Date  . Abdominal hysterectomy    . Cesarean section    . Bunion removal Right   . Cardiac catheterization  07/05/14    Prior to Admission medications   Medication Sig Start Date End Date Taking? Authorizing Provider  amitriptyline (ELAVIL) 10 MG tablet Take 1 tablet (10 mg total) by mouth daily as needed. 01/29/15  Yes Gabriel Cirriheryl Wicker, NP  atorvastatin (LIPITOR) 40 MG tablet Take 1 tablet (40 mg total) by mouth daily.  01/29/15 01/29/16 Yes Gabriel Cirriheryl Wicker, NP  calcium carbonate (TUMS EX) 750 MG chewable tablet Chew 1 tablet by mouth daily.   Yes Historical Provider, MD  Cholecalciferol (VITAMIN D-3 PO) Take 2,000 Units by mouth daily.   Yes Historical Provider, MD  estradiol (ESTRACE) 1 MG tablet Take 1 tablet (1 mg total) by mouth daily. 02/13/15  Yes Gabriel Cirriheryl Wicker, NP  levothyroxine (SYNTHROID, LEVOTHROID) 112 MCG tablet Take 1 tablet (112 mcg total) by mouth daily. 01/29/15  Yes Gabriel Cirriheryl Wicker, NP  Multiple Vitamins-Minerals (MULTI + OMEGA-3 ADULT GUMMIES PO) Take by mouth daily.   Yes Historical Provider, MD  omeprazole (PRILOSEC) 40 MG capsule Take 1 capsule (40 mg total) by mouth daily. 01/29/15  Yes Gabriel Cirriheryl Wicker, NP  oxyCODONE (OXYCONTIN) 20 mg 12 hr tablet Take 20 mg by mouth every 12 (twelve) hours.   Yes Historical Provider, MD  Oxycodone HCl 10 MG TABS Take 10 mg by mouth 3 (three) times daily as needed.   Yes Historical Provider, MD  potassium chloride SA (K-DUR,KLOR-CON) 20 MEQ tablet Take 1 tablet (20 mEq total) by mouth daily. 01/29/15 01/29/16 Yes Gabriel Cirriheryl Wicker, NP  pregabalin (LYRICA) 100 MG capsule Take 100 mg by mouth 3 (three) times daily.   Yes Historical Provider, MD  topiramate (TOPAMAX) 50 MG tablet Take 1  tablet (50 mg total) by mouth 2 (two) times daily. 01/29/15  Yes Gabriel Cirri, NP  Turmeric Curcumin 500 MG CAPS Take 500 mg by mouth daily.   Yes Historical Provider, MD  hydrochlorothiazide (HYDRODIURIL) 25 MG tablet Take 1 tablet (25 mg total) by mouth daily. Patient not taking: Reported on 05/28/2015 04/21/15   Gabriel Cirri, NP  phenazopyridine (PYRIDIUM) 200 MG tablet Take 1 tablet (200 mg total) by mouth 3 (three) times daily as needed for pain. Patient not taking: Reported on 05/28/2015 02/23/15   Sharman Cheek, MD  promethazine (PHENERGAN) 25 MG tablet Take 1 tablet (25 mg total) by mouth every 8 (eight) hours as needed for nausea or vomiting. Patient not taking: Reported on  05/28/2015 05/04/15   Gabriel Cirri, NP  tamsulosin (FLOMAX) 0.4 MG CAPS capsule Take 1 capsule (0.4 mg total) by mouth daily. Patient not taking: Reported on 05/28/2015 02/23/15   Sharman Cheek, MD    Family History  Problem Relation Age of Onset  . Depression Mother   . Anxiety disorder Mother   . Cancer Mother     breast  . Migraines Mother   . Stroke Father   . Heart disease Father     MI  . Alcohol abuse Father   . Arthritis Father   . Migraines Father   . Thyroid disease Father   . Thyroid disease Maternal Grandmother   . Hyperlipidemia Maternal Grandmother   . Migraines Brother   . Anxiety disorder Brother   . Heart disease Maternal Grandfather     MI  . Hyperlipidemia Paternal Grandmother   . Thyroid disease Paternal Grandmother   . Heart disease Paternal Grandfather     MI  . Migraines Daughter   . Migraines Son   . Migraines Son   . Migraines Daughter      Social History  Substance Use Topics  . Smoking status: Never Smoker   . Smokeless tobacco: Never Used  . Alcohol Use: No    Allergies as of 05/28/2015 - Review Complete 05/08/2015  Allergen Reaction Noted  . Penicillins Rash 11/05/2014  . Sulfa antibiotics Nausea And Vomiting 11/05/2014    Review of Systems:    All systems reviewed and negative except where noted in HPI.   Physical Exam:  BP 133/99 mmHg  Pulse 102  Temp(Src) 97.6 F (36.4 C) (Oral)  Ht  (1.549 m)  Wt 133 lb (60.328 kg)  BMI 25.14 kg/m2  LMP  (LMP Unknown) No LMP recorded (lmp unknown). Patient has had a hysterectomy. Psych:  Alert and cooperative. Normal mood and affect. General:   Alert,  Well-developed, well-nourished, pleasant and cooperative in NAD Head:  Normocephalic and atraumatic. Eyes:  Sclera clear, no icterus.   Conjunctiva pink. Ears:  Normal auditory acuity. Nose:  No deformity, discharge, or lesions. Mouth:  No deformity or lesions,oropharynx pink & moist. Neck:  Supple; no masses or  thyromegaly. Lungs:  Respirations even and unlabored.  Clear throughout to auscultation.   No wheezes, crackles, or rhonchi. No acute distress. Heart:  Regular rate and rhythm; no murmurs, clicks, rubs, or gallops. Abdomen:  Normal bowel sounds.  No bruits.  Soft, non-tender and non-distended without masses, hepatosplenomegaly or hernias noted.  No guarding or rebound tenderness.  Negative Carnett sign.   Rectal:  Deferred.  Msk:  Symmetrical without gross deformities.  Good, equal movement & strength bilaterally. Pulses:  Normal pulses noted. Extremities:  No clubbing or edema.  No cyanosis. Neurologic:  Alert and  oriented x3;  grossly normal neurologically. Skin:  Intact without significant lesions or rashes.  No jaundice. Lymph Nodes:  No significant cervical adenopathy. Psych:  Alert and cooperative. Normal mood and affect.  Imaging Studies: No results found.  Assessment and Plan:   Alyssa Zuniga is a 50 y.o. y/o female comes in today with rectal bleeding nausea with heartburn and diarrhea with stool incontinence. The patient will be started on a trial of Dexilant for her nausea and heartburn. The patient will also be set up for a colonoscopy due to her rectal bleeding. In addition to the colonoscopy she will be set up for an upper endoscopy.I have discussed risks & benefits which include, but are not limited to, bleeding, infection, perforation & drug reaction.  The patient agrees with this plan & written consent will be obtained.      Note: This dictation was prepared with Dragon dictation along with smaller phrase technology. Any transcriptional errors that result from this process are unintentional.

## 2015-05-29 ENCOUNTER — Other Ambulatory Visit: Payer: Self-pay

## 2015-06-10 ENCOUNTER — Encounter: Payer: Self-pay | Admitting: *Deleted

## 2015-06-11 ENCOUNTER — Telehealth: Payer: Self-pay | Admitting: Unknown Physician Specialty

## 2015-06-11 NOTE — Telephone Encounter (Signed)
[facility]. Please follow up with pt. Thanks.

## 2015-06-11 NOTE — Telephone Encounter (Signed)
No further information provided, caller hung up.

## 2015-06-11 NOTE — Discharge Instructions (Signed)

## 2015-06-12 NOTE — Telephone Encounter (Signed)
Called and left patient a voicemail asking for her to please return my call.  

## 2015-06-15 ENCOUNTER — Ambulatory Visit: Payer: BLUE CROSS/BLUE SHIELD | Admitting: Anesthesiology

## 2015-06-15 ENCOUNTER — Ambulatory Visit
Admission: RE | Admit: 2015-06-15 | Discharge: 2015-06-15 | Disposition: A | Discharge status: Home / Self Care | Payer: BLUE CROSS/BLUE SHIELD | Source: Ambulatory Visit | Attending: Gastroenterology | Admitting: Gastroenterology

## 2015-06-15 ENCOUNTER — Encounter
Admission: RE | Disposition: A | Discharge status: Home / Self Care | Payer: Self-pay | Source: Ambulatory Visit | Attending: Gastroenterology

## 2015-06-15 DIAGNOSIS — Z9889 Other specified postprocedural states: Secondary | ICD-10-CM | POA: Insufficient documentation

## 2015-06-15 DIAGNOSIS — I10 Essential (primary) hypertension: Secondary | ICD-10-CM | POA: Diagnosis not present

## 2015-06-15 DIAGNOSIS — Z811 Family history of alcohol abuse and dependence: Secondary | ICD-10-CM | POA: Insufficient documentation

## 2015-06-15 DIAGNOSIS — K219 Gastro-esophageal reflux disease without esophagitis: Secondary | ICD-10-CM | POA: Insufficient documentation

## 2015-06-15 DIAGNOSIS — E039 Hypothyroidism, unspecified: Secondary | ICD-10-CM | POA: Diagnosis not present

## 2015-06-15 DIAGNOSIS — Z8249 Family history of ischemic heart disease and other diseases of the circulatory system: Secondary | ICD-10-CM | POA: Diagnosis not present

## 2015-06-15 DIAGNOSIS — K580 Irritable bowel syndrome with diarrhea: Secondary | ICD-10-CM | POA: Insufficient documentation

## 2015-06-15 DIAGNOSIS — K2950 Unspecified chronic gastritis without bleeding: Secondary | ICD-10-CM | POA: Insufficient documentation

## 2015-06-15 DIAGNOSIS — Z823 Family history of stroke: Secondary | ICD-10-CM | POA: Diagnosis not present

## 2015-06-15 DIAGNOSIS — M329 Systemic lupus erythematosus, unspecified: Secondary | ICD-10-CM | POA: Diagnosis not present

## 2015-06-15 DIAGNOSIS — R197 Diarrhea, unspecified: Secondary | ICD-10-CM | POA: Insufficient documentation

## 2015-06-15 DIAGNOSIS — K625 Hemorrhage of anus and rectum: Secondary | ICD-10-CM | POA: Diagnosis not present

## 2015-06-15 DIAGNOSIS — Z79899 Other long term (current) drug therapy: Secondary | ICD-10-CM | POA: Diagnosis not present

## 2015-06-15 DIAGNOSIS — M1990 Unspecified osteoarthritis, unspecified site: Secondary | ICD-10-CM | POA: Diagnosis not present

## 2015-06-15 DIAGNOSIS — Z87442 Personal history of urinary calculi: Secondary | ICD-10-CM | POA: Diagnosis not present

## 2015-06-15 DIAGNOSIS — Z882 Allergy status to sulfonamides status: Secondary | ICD-10-CM | POA: Diagnosis not present

## 2015-06-15 DIAGNOSIS — F419 Anxiety disorder, unspecified: Secondary | ICD-10-CM | POA: Insufficient documentation

## 2015-06-15 DIAGNOSIS — Z79891 Long term (current) use of opiate analgesic: Secondary | ICD-10-CM | POA: Diagnosis not present

## 2015-06-15 DIAGNOSIS — Z8349 Family history of other endocrine, nutritional and metabolic diseases: Secondary | ICD-10-CM | POA: Insufficient documentation

## 2015-06-15 DIAGNOSIS — R12 Heartburn: Secondary | ICD-10-CM | POA: Diagnosis not present

## 2015-06-15 DIAGNOSIS — Z803 Family history of malignant neoplasm of breast: Secondary | ICD-10-CM | POA: Insufficient documentation

## 2015-06-15 DIAGNOSIS — K529 Noninfective gastroenteritis and colitis, unspecified: Secondary | ICD-10-CM | POA: Insufficient documentation

## 2015-06-15 DIAGNOSIS — F329 Major depressive disorder, single episode, unspecified: Secondary | ICD-10-CM | POA: Diagnosis not present

## 2015-06-15 DIAGNOSIS — K2970 Gastritis, unspecified, without bleeding: Secondary | ICD-10-CM | POA: Insufficient documentation

## 2015-06-15 DIAGNOSIS — G8929 Other chronic pain: Secondary | ICD-10-CM | POA: Insufficient documentation

## 2015-06-15 DIAGNOSIS — M797 Fibromyalgia: Secondary | ICD-10-CM | POA: Diagnosis not present

## 2015-06-15 DIAGNOSIS — Z820 Family history of epilepsy and other diseases of the nervous system: Secondary | ICD-10-CM | POA: Diagnosis not present

## 2015-06-15 DIAGNOSIS — Z90710 Acquired absence of both cervix and uterus: Secondary | ICD-10-CM | POA: Insufficient documentation

## 2015-06-15 DIAGNOSIS — Z880 Allergy status to penicillin: Secondary | ICD-10-CM | POA: Insufficient documentation

## 2015-06-15 HISTORY — PX: ESOPHAGOGASTRODUODENOSCOPY (EGD) WITH PROPOFOL: SHX5813

## 2015-06-15 HISTORY — PX: COLONOSCOPY WITH PROPOFOL: SHX5780

## 2015-06-15 HISTORY — DX: Hypothyroidism, unspecified: E03.9

## 2015-06-15 HISTORY — DX: Gastro-esophageal reflux disease without esophagitis: K21.9

## 2015-06-15 SURGERY — COLONOSCOPY WITH PROPOFOL
Anesthesia: Monitor Anesthesia Care

## 2015-06-15 MED ORDER — LIDOCAINE HCL (CARDIAC) 20 MG/ML IV SOLN
INTRAVENOUS | Status: DC | PRN
  Administered 2015-06-15: 40 mg via INTRAVENOUS

## 2015-06-15 MED ORDER — STERILE WATER FOR IRRIGATION IR SOLN
Status: DC | PRN
  Administered 2015-06-15: 11:00:00

## 2015-06-15 MED ORDER — LACTATED RINGERS IV SOLN
INTRAVENOUS | Status: DC
  Administered 2015-06-15 (×2): via INTRAVENOUS

## 2015-06-15 MED ORDER — GLYCOPYRROLATE 0.2 MG/ML IJ SOLN
INTRAMUSCULAR | Status: DC | PRN
  Administered 2015-06-15: 0.2 mg via INTRAVENOUS

## 2015-06-15 MED ORDER — PROPOFOL 10 MG/ML IV BOLUS
INTRAVENOUS | Status: DC | PRN
Start: 2015-06-15 — End: 2015-06-15
  Administered 2015-06-15: 100 mg via INTRAVENOUS
  Administered 2015-06-15 (×8): 25 mg via INTRAVENOUS

## 2015-06-15 SURGICAL SUPPLY — 33 items
BALLN DILATOR 10-12 8 (BALLOONS)
BALLN DILATOR 12-15 8 (BALLOONS)
BALLN DILATOR 15-18 8 (BALLOONS)
BALLN DILATOR CRE 0-12 8 (BALLOONS)
BALLN DILATOR ESOPH 8 10 CRE (MISCELLANEOUS) IMPLANT
BALLOON DILATOR 12-15 8 (BALLOONS) IMPLANT
BALLOON DILATOR 15-18 8 (BALLOONS) IMPLANT
BALLOON DILATOR CRE 0-12 8 (BALLOONS) IMPLANT
BLOCK BITE 60FR ADLT L/F GRN (MISCELLANEOUS) ×2 IMPLANT
CANISTER SUCT 1200ML W/VALVE (MISCELLANEOUS) ×2 IMPLANT
CLIP HMST 235XBRD CATH ROT (MISCELLANEOUS) IMPLANT
CLIP RESOLUTION 360 11X235 (MISCELLANEOUS)
FCP ESCP3.2XJMB 240X2.8X (MISCELLANEOUS) ×1
FORCEPS BIOP RAD 4 LRG CAP 4 (CUTTING FORCEPS) IMPLANT
FORCEPS BIOP RJ4 240 W/NDL (MISCELLANEOUS) ×1
FORCEPS ESCP3.2XJMB 240X2.8X (MISCELLANEOUS) ×1 IMPLANT
GOWN CVR UNV OPN BCK APRN NK (MISCELLANEOUS) ×2 IMPLANT
GOWN ISOL THUMB LOOP REG UNIV (MISCELLANEOUS) ×2
INJECTOR VARIJECT VIN23 (MISCELLANEOUS) IMPLANT
KIT DEFENDO VALVE AND CONN (KITS) IMPLANT
KIT ENDO PROCEDURE OLY (KITS) ×2 IMPLANT
MARKER SPOT ENDO TATTOO 5ML (MISCELLANEOUS) IMPLANT
PAD GROUND ADULT SPLIT (MISCELLANEOUS) IMPLANT
PROBE APC STR FIRE (PROBE) IMPLANT
SNARE SHORT THROW 13M SML OVAL (MISCELLANEOUS) IMPLANT
SNARE SHORT THROW 30M LRG OVAL (MISCELLANEOUS) ×2 IMPLANT
SNARE SNG USE RND 15MM (INSTRUMENTS) IMPLANT
SPOT EX ENDOSCOPIC TATTOO (MISCELLANEOUS)
SYR INFLATION 60ML (SYRINGE) IMPLANT
TRAP ETRAP POLY (MISCELLANEOUS) IMPLANT
VARIJECT INJECTOR VIN23 (MISCELLANEOUS)
WATER STERILE IRR 250ML POUR (IV SOLUTION) ×2 IMPLANT
WIRE CRE 18-20MM 8CM F G (MISCELLANEOUS) IMPLANT

## 2015-06-15 NOTE — Transfer of Care (Signed)
Immediate Anesthesia Transfer of Care Note  Patient: Alyssa CrowLinda G Capurro  Procedure(s) Performed: Procedure(s): COLONOSCOPY WITH PROPOFOL (N/A) ESOPHAGOGASTRODUODENOSCOPY (EGD) WITH PROPOFOL (N/A)  Patient Location: PACU  Anesthesia Type: MAC  Level of Consciousness: awake, alert  and patient cooperative  Airway and Oxygen Therapy: Patient Spontanous Breathing and Patient connected to supplemental oxygen  Post-op Assessment: Post-op Vital signs reviewed, Patient's Cardiovascular Status Stable, Respiratory Function Stable, Patent Airway and No signs of Nausea or vomiting  Post-op Vital Signs: Reviewed and stable  Complications: No apparent anesthesia complications

## 2015-06-15 NOTE — Telephone Encounter (Signed)
Called and left patient a voicemail asking for her to please return my call.  

## 2015-06-15 NOTE — H&P (Signed)
[facility]  [address], [address] [place], [place] [address] Phone: [phone] Fax : [phone]  Primary Care Physician:  Gabriel Cirri, NP Primary Gastroenterologist:  Dr. Servando Snare  Pre-Procedure History & Physical: HPI:  Alyssa Zuniga is a 50 y.o. female is here for an endoscopy and colonoscopy.   Past Medical History  Diagnosis Date  . Hypertension   . Fibromyalgia   . 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 IBS (irritable bowel syndrome)   . Depression   . Anxiety   . Chronic pain     lupus, fibromyalgia  . Occasional tremors   . Kidney stones   . GERD (gastroesophageal reflux disease)   . Migraines     2-3x/wk  . Headache, common migraine   . Hypothyroidism     Past Surgical History  Procedure Laterality Date  . Abdominal hysterectomy    . Cesarean section    . Bunion removal Right   . Cardiac catheterization  07/05/14    [facility]    Prior to Admission medications   Medication Sig Start Date End Date Taking? Authorizing Provider  amitriptyline (ELAVIL) 10 MG tablet Take 1 tablet (10 mg total) by mouth daily as needed. 01/29/15  Yes Gabriel Cirri, NP  atorvastatin (LIPITOR) 40 MG tablet Take 1 tablet (40 mg total) by mouth daily. 01/29/15 01/29/16 Yes Gabriel Cirri, NP  calcium carbonate (TUMS EX) 750 MG chewable tablet Chew 1 tablet by mouth daily.   Yes Historical Provider, MD  Cholecalciferol (VITAMIN D-3 PO) Take 2,000 Units by mouth daily.   Yes Historical Provider, MD  Cyanocobalamin (VITAMIN B-12) 500 MCG LOZG Take by mouth daily.   Yes Historical Provider, MD  dexlansoprazole (DEXILANT) 60 MG capsule Take 60 mg by mouth daily.   Yes Historical Provider, MD  estradiol (ESTRACE) 1 MG tablet Take 1 tablet (1 mg total) by mouth daily. 02/13/15  Yes Gabriel Cirri, NP  GARCINIA CAMBOGIA-CHROMIUM PO Take 1,600 mg by mouth daily.   Yes Historical Provider, MD  levothyroxine (SYNTHROID, LEVOTHROID) 112 MCG tablet Take 1 tablet (112 mcg  total) by mouth daily. 01/29/15  Yes Gabriel Cirri, NP  Magnesium 250 MG TABS Take by mouth daily.   Yes Historical Provider, MD  Multiple Vitamins-Minerals (MULTI + OMEGA-3 ADULT GUMMIES PO) Take by mouth daily.   Yes Historical Provider, MD  ondansetron (ZOFRAN) 8 MG tablet Take 8 mg by mouth every 8 (eight) hours as needed for nausea or vomiting.   Yes Historical Provider, MD  oxyCODONE (OXYCONTIN) 20 mg 12 hr tablet Take 20 mg by mouth every 12 (twelve) hours.   Yes Historical Provider, MD  Oxycodone HCl 10 MG TABS Take 10 mg by mouth 3 (three) times daily as needed.   Yes Historical Provider, MD  phenazopyridine (PYRIDIUM) 200 MG tablet Take 1 tablet (200 mg total) by mouth 3 (three) times daily as needed for pain. 02/23/15  Yes Sharman Cheek, MD  potassium chloride SA (K-DUR,KLOR-CON) 20 MEQ tablet Take 1 tablet (20 mEq total) by mouth daily. 01/29/15 01/29/16 Yes Gabriel Cirri, NP  pregabalin (LYRICA) 100 MG capsule Take 100 mg by mouth 3 (three) times daily.   Yes Historical Provider, MD  promethazine (PHENERGAN) 25 MG tablet Take 1 tablet (25 mg total) by mouth every 8 (eight) hours as needed for nausea or vomiting. 05/04/15  Yes Gabriel Cirri, NP  tamsulosin (FLOMAX) 0.4 MG CAPS capsule Take 1 capsule (0.4 mg total) by  mouth daily. 02/23/15  Yes Sharman CheekPhillip Stafford, MD  topiramate (TOPAMAX) 50 MG tablet Take 1 tablet (50 mg total) by mouth 2 (two) times daily. 01/29/15  Yes Gabriel Cirriheryl Wicker, NP  Turmeric Curcumin 500 MG CAPS Take 500 mg by mouth daily.   Yes Historical Provider, MD  hydrochlorothiazide (HYDRODIURIL) 25 MG tablet Take 1 tablet (25 mg total) by mouth daily. Patient not taking: Reported on 05/28/2015 04/21/15   Gabriel Cirriheryl Wicker, NP    Allergies as of 05/29/2015 - Review Complete 05/08/2015  Allergen Reaction Noted  . Penicillins Rash 11/05/2014  . Sulfa antibiotics Nausea And Vomiting 11/05/2014    Family History  Problem Relation Age of Onset  . Depression Mother   . Anxiety  disorder Mother   . Cancer Mother     breast  . Migraines Mother   . Stroke Father   . Heart disease Father     MI  . Alcohol abuse Father   . Arthritis Father   . Migraines Father   . Thyroid disease Father   . Thyroid disease Maternal Grandmother   . Hyperlipidemia Maternal Grandmother   . Migraines Brother   . Anxiety disorder Brother   . Heart disease Maternal Grandfather     MI  . Hyperlipidemia Paternal Grandmother   . Thyroid disease Paternal Grandmother   . Heart disease Paternal Grandfather     MI  . Migraines Daughter   . Migraines Son   . Migraines Son   . Migraines Daughter     Social History   Social History  . Marital Status: Married    Spouse Name: N/A  . Number of Children: N/A  . Years of Education: N/A   Occupational History  . Not on file.   Social History Main Topics  . Smoking status: Never Smoker   . Smokeless tobacco: Never Used  . Alcohol Use: No  . Drug Use: No  . Sexual Activity: Yes    Birth Control/ Protection: Surgical   Other Topics Concern  . Not on file   Social History Narrative    Review of Systems: See HPI, otherwise negative ROS  Physical Exam: Ht 5\' 2"  (1.575 m)  Wt 133 lb (60.328 kg)  BMI 24.32 kg/m2  LMP  (LMP Unknown) General:   Alert,  pleasant and cooperative in NAD Head:  Normocephalic and atraumatic. Neck:  Supple; no masses or thyromegaly. Lungs:  Clear throughout to auscultation.    Heart:  Regular rate and rhythm. Abdomen:  Soft, nontender and nondistended. Normal bowel sounds, without guarding, and without rebound.   Neurologic:  Alert and  oriented x4;  grossly normal neurologically.  Impression/Plan: Alyssa Zuniga is here for an endoscopy and colonoscopy to be performed for rectal bleeding, GERD and diarrhea  Risks, benefits, limitations, and alternatives regarding  endoscopy and colonoscopy have been reviewed with the patient.  Questions have been answered.  All parties agreeable.   Midge Miniumarren  Alie Hardgrove, MD  06/15/2015, 10:20 AM

## 2015-06-15 NOTE — Anesthesia Postprocedure Evaluation (Signed)
Anesthesia Post Note  Patient: Alyssa CrowLinda G Zuniga  Procedure(s) Performed: Procedure(s) (LRB): COLONOSCOPY WITH PROPOFOL (N/A) ESOPHAGOGASTRODUODENOSCOPY (EGD) WITH PROPOFOL (N/A)  Patient location during evaluation: PACU Anesthesia Type: MAC Level of consciousness: awake and alert Pain management: pain level controlled Vital Signs Assessment: post-procedure vital signs reviewed and stable Respiratory status: spontaneous breathing, nonlabored ventilation, respiratory function stable and patient connected to nasal cannula oxygen Cardiovascular status: stable and blood pressure returned to baseline Anesthetic complications: no    Mousa Prout C

## 2015-06-15 NOTE — Anesthesia Procedure Notes (Signed)
Procedure Name: MAC Performed by: Brnadon Eoff Pre-anesthesia Checklist: Patient identified, Emergency Drugs available, Suction available, Patient being monitored and Timeout performed Patient Re-evaluated:Patient Re-evaluated prior to inductionOxygen Delivery Method: Nasal cannula       

## 2015-06-15 NOTE — Anesthesia Preprocedure Evaluation (Signed)
Anesthesia Evaluation  Patient identified by MRN, date of birth, ID band Patient awake    Reviewed: Allergy & Precautions, NPO status , Patient's Chart, lab work & pertinent test results  Airway Mallampati: II  TM Distance: >3 FB Neck ROM: Full    Dental no notable dental hx.    Pulmonary neg pulmonary ROS,    Pulmonary exam normal breath sounds clear to auscultation       Cardiovascular hypertension, negative cardio ROS Normal cardiovascular exam Rhythm:Regular Rate:Normal     Neuro/Psych  Headaches, Anxiety Depression negative neurological ROS     GI/Hepatic Neg liver ROS, GERD  ,  Endo/Other  Hypothyroidism   Renal/GU negative Renal ROS  negative genitourinary   Musculoskeletal  (+) Arthritis , Osteoarthritis,  Fibromyalgia -  Abdominal   Peds negative pediatric ROS (+)  Hematology negative hematology ROS (+)   Anesthesia Other Findings   Reproductive/Obstetrics negative OB ROS                             Anesthesia Physical Anesthesia Plan  ASA: II  Anesthesia Plan: MAC   Post-op Pain Management:    Induction: Intravenous  Airway Management Planned:   Additional Equipment:   Intra-op Plan:   Post-operative Plan: Extubation in OR  Informed Consent: I have reviewed the patients History and Physical, chart, labs and discussed the procedure including the risks, benefits and alternatives for the proposed anesthesia with the patient or authorized representative who has indicated his/her understanding and acceptance.   Dental advisory given  Plan Discussed with: CRNA  Anesthesia Plan Comments:         Anesthesia Quick Evaluation

## 2015-06-15 NOTE — Op Note (Signed)
[facility] Patient Name: Alyssa FishmanLinda Zuniga Procedure Date: 06/15/2015 10:56 AM MRN: [id number] Account #: [id number] Date of Birth: [birth year] Admit Type: Outpatient Age: 50 [facility] Gender: Female Note Status: Finalized Procedure:            Colonoscopy Indications:          Chronic diarrhea Providers:            Midge Miniumarren Ella Guillotte, MD Medicines:            Propofol per Anesthesia Complications:        No immediate complications. Procedure:            Pre-Anesthesia Assessment:                       - Prior to the procedure, a History and Physical was                        performed, and patient medications and allergies were                        reviewed. The patient's tolerance of previous                        anesthesia was also reviewed. The risks and benefits of                        the procedure and the sedation options and risks were                        discussed with the patient. All questions were                        answered, and informed consent was obtained. Prior                        Anticoagulants: The patient has taken no previous                        anticoagulant or antiplatelet agents. ASA Grade                        Assessment: II - A patient with mild systemic disease.                        After reviewing the risks and benefits, the patient was                        deemed in satisfactory condition to undergo the                        procedure.                       After obtaining informed consent, the colonoscope was                        passed under direct vision. Throughout the procedure,                        the patient's blood pressure, pulse, and oxygen  saturations were monitored continuously. The Olympus CF                        H180AL colonoscope (S#: [id number]) was introduced through                        the anus and advanced to the the terminal ileum. The                 colonoscopy was performed without difficulty. The                        patient tolerated the procedure well. The quality of                        the bowel preparation was excellent. Findings:      The perianal and digital rectal examinations were normal.      The colon (entire examined portion) appeared normal. Random biopsies       were obtained with cold forceps for histology randomly in the entire       colon.      The terminal ileum appeared normal. Random biopsies were obtained with       cold forceps for histology randomly in the terminal ileum. Impression:           - The entire examined colon is normal.                       - The examined portion of the ileum was normal.                       - Random biopsies were obtained in the entire colon.                       - Random biopsies were obtained in the terminal ileum. Recommendation:       - Await pathology results. Procedure Code(s):    --- Professional ---                       [phone], Colonoscopy, flexible; with biopsy, single or                        multiple Diagnosis Code(s):    --- Professional ---                       K52.9, Noninfective gastroenteritis and colitis,                        unspecified CPT copyright 2016 American Medical Association. All rights reserved. The codes documented in this report are preliminary and upon coder review may  be revised to meet current compliance requirements. Midge Minium, MD 06/15/2015 11:28:39 AM This report has been signed electronically. Number of Addenda: 0 Note Initiated On: 06/15/2015 10:56 AM Scope Withdrawal Time: 0 hours 7 minutes 20 seconds  Total Procedure Duration: 0 hours 9 minutes 54 seconds       [facility]

## 2015-06-15 NOTE — Op Note (Signed)
[facility] Patient Name: Alyssa Zuniga Procedure Date: 06/15/2015 10:57 AM MRN: [id number] Account #: [id number] Date of Birth: [birth year] Admit Type: Outpatient Age: 50 Room: [facility] OR ROOM 01 Gender: Female Note Status: Finalized Procedure:            Upper GI endoscopy Indications:          Heartburn, Diarrhea Providers:            Midge Minium, MD Referring MD:         Wendall Papa Fnp, MD (Referring MD) Medicines:            Propofol per Anesthesia Complications:        No immediate complications. Procedure:            Pre-Anesthesia Assessment:                       - Prior to the procedure, a History and Physical was                        performed, and patient medications and allergies were                        reviewed. The patient's tolerance of previous                        anesthesia was also reviewed. The risks and benefits of                        the procedure and the sedation options and risks were                        discussed with the patient. All questions were                        answered, and informed consent was obtained. Prior                        Anticoagulants: The patient has taken no previous                        anticoagulant or antiplatelet agents. ASA Grade                        Assessment: II - A patient with mild systemic disease.                        After reviewing the risks and benefits, the patient was                        deemed in satisfactory condition to undergo the                        procedure.                       After obtaining informed consent, the endoscope was                        passed under direct vision. Throughout the procedure,  the patient's blood pressure, pulse, and oxygen                        saturations were monitored continuously. The was                        introduced through the mouth, and advanced to the        second part of duodenum. The upper GI endoscopy was                        accomplished without difficulty. The patient tolerated                        the procedure well. Findings:      The examined esophagus was normal.      Localized minimal inflammation characterized by erythema was found in       the gastric antrum. Biopsies were taken with a cold forceps for       histology.      The examined duodenum was normal. Biopsies were taken with a cold       forceps for histology. Impression:           - Normal esophagus.                       - Gastritis. Biopsied.                       - Normal examined duodenum. Biopsied. Recommendation:       - Await pathology results.                       - Perform a colonoscopy today. Procedure Code(s):    --- Professional ---                       [phone], Esophagogastroduodenoscopy, flexible, transoral;                        with biopsy, single or multiple Diagnosis Code(s):    --- Professional ---                       R12, Heartburn                       R19.7, Diarrhea, unspecified                       K29.70, Gastritis, unspecified, without bleeding CPT copyright 2016 American Medical Association. All rights reserved. The codes documented in this report are preliminary and upon coder review may  be revised to meet current compliance requirements. Midge Miniumarren Alyssa Vogan, MD 06/15/2015 11:12:59 AM This report has been signed electronically. Number of Addenda: 0 Note Initiated On: 06/15/2015 10:57 AM Total Procedure Duration: 0 hours 3 minutes 49 seconds       [facility]

## 2015-06-16 ENCOUNTER — Encounter: Payer: Self-pay | Admitting: Gastroenterology

## 2015-06-16 NOTE — Telephone Encounter (Signed)
[facility] The lady I spoke to stated that she does not remember calling us but the girl that normally works with her was not there today. She stated she didn't know if she tried to call but stated that she did not see anything as to why she would call us. She stated that if anything came up then they would call us back.

## 2015-06-16 NOTE — Telephone Encounter (Signed)
Called and spoke to patient. [phone].

## 2015-06-18 ENCOUNTER — Telehealth: Payer: Self-pay

## 2015-06-18 ENCOUNTER — Encounter: Payer: Self-pay | Admitting: Gastroenterology

## 2015-06-18 NOTE — Telephone Encounter (Signed)
Pt notified of colonoscopy and EGD results. Pt to continue Dexilant daily.

## 2015-06-18 NOTE — Telephone Encounter (Signed)
-----   Message from Midge Miniumarren Wohl, MD sent at 06/18/2015 12:38 PM EDT ----- Let the patient know that the biopsies of the intestines and the colon were all normal. There was some inflammation in the stomach but no infection. The inflammation is likely from the acid in the stomach. There is nothing recommended for this at this time.

## 2015-06-24 ENCOUNTER — Ambulatory Visit (INDEPENDENT_AMBULATORY_CARE_PROVIDER_SITE_OTHER): Payer: BLUE CROSS/BLUE SHIELD | Admitting: Unknown Physician Specialty

## 2015-06-24 ENCOUNTER — Other Ambulatory Visit: Payer: Self-pay | Admitting: Unknown Physician Specialty

## 2015-06-24 ENCOUNTER — Telehealth: Payer: Self-pay | Admitting: Unknown Physician Specialty

## 2015-06-24 ENCOUNTER — Encounter: Payer: Self-pay | Admitting: Unknown Physician Specialty

## 2015-06-24 VITALS — BP 159/94 | HR 68 | Temp 97.9°F | Ht 61.2 in | Wt 129.4 lb

## 2015-06-24 DIAGNOSIS — M797 Fibromyalgia: Secondary | ICD-10-CM | POA: Diagnosis not present

## 2015-06-24 DIAGNOSIS — R51 Headache: Secondary | ICD-10-CM | POA: Diagnosis not present

## 2015-06-24 DIAGNOSIS — R251 Tremor, unspecified: Secondary | ICD-10-CM

## 2015-06-24 DIAGNOSIS — I10 Essential (primary) hypertension: Secondary | ICD-10-CM

## 2015-06-24 DIAGNOSIS — R519 Headache, unspecified: Secondary | ICD-10-CM | POA: Insufficient documentation

## 2015-06-24 DIAGNOSIS — G8929 Other chronic pain: Secondary | ICD-10-CM | POA: Diagnosis not present

## 2015-06-24 DIAGNOSIS — N3090 Cystitis, unspecified without hematuria: Secondary | ICD-10-CM | POA: Diagnosis not present

## 2015-06-24 DIAGNOSIS — G6289 Other specified polyneuropathies: Secondary | ICD-10-CM | POA: Diagnosis not present

## 2015-06-24 DIAGNOSIS — R309 Painful micturition, unspecified: Secondary | ICD-10-CM

## 2015-06-24 DIAGNOSIS — G4700 Insomnia, unspecified: Secondary | ICD-10-CM

## 2015-06-24 MED ORDER — NITROFURANTOIN MACROCRYSTAL 100 MG PO CAPS: 100.0000 mg | ORAL_CAPSULE | Freq: Two times a day (BID) | ORAL | Status: DC

## 2015-06-24 MED ORDER — AMITRIPTYLINE HCL 25 MG PO TABS: 25.0000 mg | ORAL_TABLET | Freq: Every day | ORAL | Status: DC

## 2015-06-24 MED ORDER — HYDROCHLOROTHIAZIDE 25 MG PO TABS: 25.0000 mg | ORAL_TABLET | Freq: Every day | ORAL | Status: DC

## 2015-06-24 MED ORDER — NITROFURANTOIN MONOHYD MACRO 100 MG PO CAPS: 100.0000 mg | ORAL_CAPSULE | Freq: Two times a day (BID) | ORAL | Status: DC

## 2015-06-24 NOTE — Assessment & Plan Note (Signed)
Now states bowel and bladder problems and numbness of feet.  I will refer her to neurology for further evaluation.

## 2015-06-24 NOTE — Patient Instructions (Signed)
Work on [employer] meditation.  At least 10 minutes/day

## 2015-06-24 NOTE — Assessment & Plan Note (Signed)
Increase Amitriptyline

## 2015-06-24 NOTE — Assessment & Plan Note (Signed)
Seems to be flaring.  I think psychiatry will help

## 2015-06-24 NOTE — Telephone Encounter (Signed)
Routing to provider  

## 2015-06-24 NOTE — Assessment & Plan Note (Signed)
Pt with "ice-pick" headaches. Discussed stretching

## 2015-06-24 NOTE — Assessment & Plan Note (Signed)
Restart HCTZ 

## 2015-06-24 NOTE — Telephone Encounter (Signed)
[facility] called and would like to know if the pt should be taking macrobid bid or macrodant qid(not sure if how to spell them sorry)

## 2015-06-24 NOTE — Progress Notes (Signed)
BP 159/94 mmHg  Pulse 68  Temp(Src) 97.9 F (36.6 C)  Ht 5' 1.2" (1.554 m)  Wt 129 lb 6.4 oz (58.695 kg)  BMI 24.31 kg/m2  SpO2 97%  LMP  (LMP Unknown)   Subjective:    Patient ID: Alyssa Zuniga, female    DOB: 05-Oct-1965, 50 y.o.   MRN: [id number]  HPI: Alyssa Zuniga is a 50 y.o. female  Chief Complaint  Patient presents with  . Urinary Tract Infection    pt states she has had pain when urinating for about a week now   Urinary Tract Infection  This is a new problem. The current episode started in the past 7 days. The problem has been unchanged. The quality of the pain is described as burning. There has been no fever. Associated symptoms include urgency. Pertinent negatives include no chills, discharge, flank pain, hematuria, hesitancy, nausea, sweats or vomiting.   Pt states she is having daily headaches.  They are not like her typical migraines.  It is a shooting pain from mid forehead and radiate backwards.  She is not sleeping at all.  She does have an appt with psychiatry at [facility]" but has established care yet.  Taking Amitriptyline 10 mg and "it's not enough" despite taking 3-4 at a time and ran out early.    Hypertension.  Came HCTZ as was feeling awful but no change and willing to rechallenge herself with HCTZ.    Back pain Pt with chronic back pain and under the care of pain management.  She is going to PT.  She is also having numbness of feet, right more than left with incontinence of bowel and bladder.  States last MRI was around 3 years ago.  She states she has never seen a neurologist.  She also has a chronic tremor.    Relevant past medical, surgical, family and social history reviewed and updated as indicated. Interim medical history since our last visit reviewed. Allergies and medications reviewed and updated.  Review of Systems  Constitutional: Negative for chills.  Gastrointestinal: Negative for nausea and vomiting.  Genitourinary: Positive for  urgency. Negative for hesitancy, hematuria and flank pain.    Per HPI unless specifically indicated above     Objective:    BP 159/94 mmHg  Pulse 68  Temp(Src) 97.9 F (36.6 C)  Ht 5' 1.2" (1.554 m)  Wt 129 lb 6.4 oz (58.695 kg)  BMI 24.31 kg/m2  SpO2 97%  LMP  (LMP Unknown)  Wt Readings from Last 3 Encounters:  06/24/15 129 lb 6.4 oz (58.695 kg)  06/15/15 126 lb (57.153 kg)  05/28/15 133 lb (60.328 kg)    Physical Exam  Constitutional: She is oriented to person, place, and time. She appears well-developed and well-nourished. No distress.  HENT:  Head: Normocephalic and atraumatic.  Eyes: Conjunctivae and lids are normal. Right eye exhibits no discharge. Left eye exhibits no discharge. No scleral icterus.  Cardiovascular: Normal rate.   Pulmonary/Chest: Effort normal.  Abdominal: Normal appearance. There is no splenomegaly or hepatomegaly.  Musculoskeletal: Normal range of motion.  Neurological: She is alert and oriented to person, place, and time.  Skin: Skin is intact. No rash noted. No pallor.  Psychiatric: She has a normal mood and affect. Her behavior is normal. Judgment and thought content normal.    Results for orders placed or performed in visit on 04/21/15  ANA w/Reflex  Result Value Ref Range   Anit Nuclear Antibody(ANA) Negative Negative  Comprehensive  metabolic panel  Result Value Ref Range   Glucose 87 65 - 99 mg/dL   BUN 8 6 - 24 mg/dL   Creatinine, Ser 1.610.78 0.57 - 1.00 mg/dL   GFR calc non Af Amer 90 >59 mL/min/1.73   GFR calc Af Amer 104 >59 mL/min/1.73   BUN/Creatinine Ratio 10 9 - 23   Sodium 143 134 - 144 mmol/L   Potassium 3.7 3.5 - 5.2 mmol/L   Chloride 102 96 - 106 mmol/L   CO2 23 18 - 29 mmol/L   Calcium 9.5 8.7 - 10.2 mg/dL   Total Protein 7.7 6.0 - 8.5 g/dL   Albumin 4.5 3.5 - 5.5 g/dL   Globulin, Total 3.2 1.5 - 4.5 g/dL   Albumin/Globulin Ratio 1.4 1.2 - 2.2   Bilirubin Total 0.3 0.0 - 1.2 mg/dL   Alkaline Phosphatase 103 39 - 117  IU/L   AST 24 0 - 40 IU/L   ALT 43 (H) 0 - 32 IU/L  CBC with Differential/Platelet  Result Value Ref Range   WBC 10.1 3.4 - 10.8 x10E3/uL   RBC 4.36 3.77 - 5.28 x10E6/uL   Hemoglobin 13.4 11.1 - 15.9 g/dL   Hematocrit 09.640.5 04.534.0 - 46.6 %   MCV 93 79 - 97 fL   MCH 30.7 26.6 - 33.0 pg   MCHC 33.1 31.5 - 35.7 g/dL   RDW 40.914.9 81.112.3 - 91.415.4 %   Platelets 419 (H) 150 - 379 x10E3/uL   Neutrophils 48 %   Lymphs 41 %   Monocytes 6 %   Eos 4 %   Basos 1 %   Neutrophils Absolute 4.9 1.4 - 7.0 x10E3/uL   Lymphocytes Absolute 4.2 (H) 0.7 - 3.1 x10E3/uL   Monocytes Absolute 0.6 0.1 - 0.9 x10E3/uL   EOS (ABSOLUTE) 0.4 0.0 - 0.4 x10E3/uL   Basophils Absolute 0.1 0.0 - 0.2 x10E3/uL   Immature Granulocytes 0 %   Immature Grans (Abs) 0.0 0.0 - 0.1 x10E3/uL   Hematology Comments: Note:       Assessment & Plan:   Problem List Items Addressed This Visit      Unprioritized   Benign hypertension    Restart HCTZ      Relevant Medications   hydrochlorothiazide (HYDRODIURIL) 25 MG tablet   Chronic pain    Now states bowel and bladder problems and numbness of feet.  I will refer her to neurology for further evaluation.        Relevant Medications   oxyCODONE (ROXICODONE) 15 MG immediate release tablet   amitriptyline (ELAVIL) 25 MG tablet   Other Relevant Orders   Ambulatory referral to Neurology   Fibromyalgia    Seems to be flaring.  I think psychiatry will help      Headache disorder    Pt with "ice-pick" headaches. Discussed stretching      Relevant Medications   oxyCODONE (ROXICODONE) 15 MG immediate release tablet   amitriptyline (ELAVIL) 25 MG tablet   Other Relevant Orders   Ambulatory referral to Neurology   Insomnia    Increase Amitriptyline      Tremors of nervous system   Relevant Orders   Ambulatory referral to Neurology    Other Visit Diagnoses    Pain with urination    -  Primary    Relevant Orders    UA/M w/rflx Culture, Routine    Cystitis        Other  polyneuropathy (HCC)        Relevant Medications  amitriptyline (ELAVIL) 25 MG tablet    Other Relevant Orders    Ambulatory referral to Neurology        Follow up plan: Return in about 4 weeks (around 07/22/2015).

## 2015-06-24 NOTE — Progress Notes (Signed)
Called and left patient a voicemail letting her know that rx was sent to pharmacy.

## 2015-06-30 LAB — MICROSCOPIC EXAMINATION

## 2015-06-30 LAB — UA/M W/RFLX CULTURE, ROUTINE
Bilirubin, UA: NEGATIVE
GLUCOSE, UA: NEGATIVE
Ketones, UA: NEGATIVE
Nitrite, UA: NEGATIVE
PROTEIN UA: NEGATIVE
RBC, UA: NEGATIVE
SPEC GRAV UA: 1.01 (ref 1.005–1.030)
Urobilinogen, Ur: 0.2 mg/dL (ref 0.2–1.0)
pH, UA: 7 (ref 5.0–7.5)

## 2015-06-30 LAB — URINE CULTURE, REFLEX

## 2015-07-09 ENCOUNTER — Telehealth: Payer: Self-pay | Admitting: Unknown Physician Specialty

## 2015-07-09 NOTE — Telephone Encounter (Signed)
Pt called and stated that she would like to stop the topiramate (TOPAMAX) 50 MG tablet and start only taking the amitriptyline (ELAVIL) 25 MG tablet. She stated that the amitriptyline helped with her headches but she had to take 2 so she is out but she would like to have them sent to [facility] [place].

## 2015-07-09 NOTE — Telephone Encounter (Signed)
Forwarded to CW 

## 2015-07-10 MED ORDER — AMITRIPTYLINE HCL 25 MG PO TABS: 50.0000 mg | ORAL_TABLET | Freq: Every day | ORAL | Status: DC

## 2015-07-10 NOTE — Telephone Encounter (Signed)
OK 

## 2015-07-22 ENCOUNTER — Ambulatory Visit: Payer: BLUE CROSS/BLUE SHIELD | Admitting: Unknown Physician Specialty

## 2015-07-24 ENCOUNTER — Telehealth: Payer: Self-pay | Admitting: Unknown Physician Specialty

## 2015-07-24 NOTE — Telephone Encounter (Signed)
Routing to Cheryl

## 2015-07-24 NOTE — Telephone Encounter (Signed)
Personal matter, would not tell me what the message was in regards to.  "It's a personal matter, just tell her I won't take up much of her time"

## 2015-07-24 NOTE — Telephone Encounter (Signed)
States back in March she was feeling badly and feeling ill.  She would like a note that allows her to "get out of it." Discussed with pt I am willing to do this but will need to be seen.

## 2015-07-29 ENCOUNTER — Ambulatory Visit (INDEPENDENT_AMBULATORY_CARE_PROVIDER_SITE_OTHER): Payer: BLUE CROSS/BLUE SHIELD | Admitting: Unknown Physician Specialty

## 2015-07-29 ENCOUNTER — Encounter: Payer: Self-pay | Admitting: Unknown Physician Specialty

## 2015-07-29 ENCOUNTER — Telehealth: Payer: Self-pay

## 2015-07-29 VITALS — BP 167/113 | HR 86 | Temp 97.7°F | Ht 61.6 in | Wt 131.4 lb

## 2015-07-29 DIAGNOSIS — T17308A Unspecified foreign body in larynx causing other injury, initial encounter: Secondary | ICD-10-CM | POA: Diagnosis not present

## 2015-07-29 DIAGNOSIS — R29810 Facial weakness: Secondary | ICD-10-CM | POA: Diagnosis not present

## 2015-07-29 DIAGNOSIS — R55 Syncope and collapse: Secondary | ICD-10-CM

## 2015-07-29 DIAGNOSIS — R0683 Snoring: Secondary | ICD-10-CM | POA: Diagnosis not present

## 2015-07-29 DIAGNOSIS — I10 Essential (primary) hypertension: Secondary | ICD-10-CM

## 2015-07-29 DIAGNOSIS — H02402 Unspecified ptosis of left eyelid: Secondary | ICD-10-CM | POA: Diagnosis not present

## 2015-07-29 MED ORDER — TRIAMTERENE-HCTZ 37.5-25 MG PO CAPS: 1.0000 | ORAL_CAPSULE | Freq: Every day | ORAL | Status: DC

## 2015-07-29 NOTE — Assessment & Plan Note (Addendum)
Intolerance to multiple medications.  I last tried HCTZ which helped her BP but she complained of being tired which is ? Related to her medication or a fibro flare.  Her BP does need to come down.  Will rechallenge her with a duretic

## 2015-07-29 NOTE — Progress Notes (Signed)
BP 167/113 mmHg  Pulse 86  Temp(Src) 97.7 F (36.5 C)  Ht 5' 1.6" (1.565 m)  Wt 131 lb 6.4 oz (59.603 kg)  BMI 24.34 kg/m2  SpO2 98%  LMP  (LMP Unknown)   Subjective:    Patient ID: Alyssa Zuniga, female    DOB: 05-17-1965, 50 y.o.   MRN: [id number]  HPI: Alyssa CrowLinda G Clayborn is a 50 y.o. female  Chief Complaint  Patient presents with  . Follow-up    4 week f/u  . balance    pt states she has been losing her balance. States it has gotten bad in the last week or so  . 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 Dysphagia    pt states she has noticed she has had trouble swallowing  . facial drooping    pt states she has noticed one side of her face drooping    States "I feel awful" has a lot of balance issues and clumsiness.  She got choked last night "really bad."  She states she "thought I was going to die."  She felt better after drinking water.  She states she feels that her neurology visit is in August.  She feels her face is drooping and she gets "choked a lot."  She is taking both Lyrica and Oxycodone and has tried decreasing Lyrica in the past which helped her memory  Relevant past medical, surgical, family and social history reviewed and updated as indicated. Interim medical history since our last visit reviewed. Allergies and medications reviewed and updated.  Review of Systems  Per HPI unless specifically indicated above     Objective:    BP 167/113 mmHg  Pulse 86  Temp(Src) 97.7 F (36.5 C)  Ht 5' 1.6" (1.565 m)  Wt 131 lb 6.4 oz (59.603 kg)  BMI 24.34 kg/m2  SpO2 98%  LMP  (LMP Unknown)  Wt Readings from Last 3 Encounters:  07/29/15 131 lb 6.4 oz (59.603 kg)  06/24/15 129 lb 6.4 oz (58.695 kg)  06/15/15 126 lb (57.153 kg)    Physical Exam  Constitutional: She is oriented to person, place, and time. She appears  well-developed and well-nourished. She appears lethargic. No distress.  HENT:  Head: Normocephalic and atraumatic.  Eyes: Conjunctivae and lids are normal. Right eye exhibits no discharge. Left eye exhibits no discharge. No scleral icterus.  Neck: Normal range of motion. Neck supple. No JVD present. Carotid bruit is not present.  Cardiovascular: Normal rate, regular rhythm and normal heart sounds.   Pulmonary/Chest: Effort normal and breath sounds normal.  Abdominal: Soft. Normal appearance. There is no splenomegaly or hepatomegaly.  Musculoskeletal: Normal range of motion.  Neurological: She is oriented to person, place, and time. She has normal strength. She appears lethargic. She displays no atrophy and no tremor. No cranial nerve deficit or sensory deficit. She exhibits normal muscle tone. She displays a negative Romberg sign. Gait abnormal. Coordination normal.  Reflex Scores:      Tricep reflexes are 2+ on the right side and 2+ on the left side.      Bicep reflexes are 2+ on the right side and 2+ on the left side.      Brachioradialis reflexes are 2+ on the right side and 2+ on the left side.      Patellar  reflexes are 2+ on the right side and 2+ on the left side.      Achilles reflexes are 2+ on the right side and 2+ on the left side. Skin: Skin is warm, dry and intact. No rash noted. No pallor.  Psychiatric: She has a normal mood and affect. Her behavior is normal. Judgment and thought content normal.   I don't appreciate any real facial drooping although she does have some face assymmetry.    Results for orders placed or performed in visit on 06/24/15  Microscopic Examination  Result Value Ref Range   WBC, UA 6-10 (A) 0 -  5 /hpf   RBC, UA 0-2 0 -  2 /hpf   Epithelial Cells (non renal) 0-10 0 - 10 /hpf   Crystals Present (A) N/A   Crystal Type Amorphous Sediment N/A   Mucus, UA Present Not Estab.   Bacteria, UA Few None seen/Few  UA/M w/rflx Culture, Routine  Result Value Ref  Range   Specific Gravity, UA 1.010 1.005 - 1.030   pH, UA 7.0 5.0 - 7.5   Color, UA Yellow Yellow   Appearance Ur Cloudy (A) Clear   Leukocytes, UA 2+ (A) Negative   Protein, UA Negative Negative/Trace   Glucose, UA Negative Negative   Ketones, UA Negative Negative   RBC, UA Negative Negative   Bilirubin, UA Negative Negative   Urobilinogen, Ur 0.2 0.2 - 1.0 mg/dL   Nitrite, UA Negative Negative   Microscopic Examination See below:    Urinalysis Reflex Comment   Urine Culture, Routine  Result Value Ref Range   Urine Culture, Routine Final report    Urine Culture result 1 Comment    RESULT 2 Comment       Assessment & Plan:   Problem List Items Addressed This Visit      Unprioritized   Hypertension    Intolerance to multiple medications.  I last tried HCTZ which helped her BP but she complained of being tired which is ? Related to her medication or a fibro flare.  Her BP does need to come down.  Will rechallenge her with a duretic      Relevant Medications   triamterene-hydrochlorothiazide (DYAZIDE) 37.5-25 MG capsule    Other Visit Diagnoses    Syncope, near    -  Primary    Relevant Medications    triamterene-hydrochlorothiazide (DYAZIDE) 37.5-25 MG capsule    Other Relevant Orders    EKG 12-Lead (Completed)    Ambulatory referral to Sleep Studies    Choking, initial encounter        Relevant Orders    Ambulatory referral to Sleep Studies    MR Brain W Wo Contrast    Snoring        Relevant Orders    Ambulatory referral to Sleep Studies    Drooping eyelid, left        Relevant Orders    MR Brain W Wo Contrast    Drooping of mouth        Relevant Orders    MR Brain W Wo Contrast       ? If episode of choking is related to sleep apnea or small stroke?  She does exhibit some facial drooping.  Will order MRI of head   Follow up plan: Return in about 2 weeks (around 08/12/2015).

## 2015-07-30 ENCOUNTER — Telehealth: Payer: Self-pay | Admitting: Unknown Physician Specialty

## 2015-07-30 NOTE — Telephone Encounter (Signed)
Pt called and stated that [facility] needs to do emergency surgery to remove a tooth but they need clearance from her PCP. Phone number is [phone]. Pt stated that her appt with them is at 1pm.

## 2015-07-31 NOTE — Telephone Encounter (Signed)
Called [facility] to follow up on the appointment from yesterday. I was informed that the pt made the appointment due to tooth pain and after evaluation they determined that tooth # 2 needed to be extracted. Due to the pts insurance she would have to pay up front and be reimbursed and the pt opted out until a later date. No future appointment is set at the time.

## 2015-07-31 NOTE — Telephone Encounter (Signed)
Called and left patient a voicemail letting her know that Elnita MaxwellCheryl said it was OK for her tooth removal.

## 2015-07-31 NOTE — Telephone Encounter (Signed)
OK for tooth removal

## 2015-08-07 ENCOUNTER — Telehealth: Payer: Self-pay

## 2015-08-07 NOTE — Telephone Encounter (Signed)
New letter typed, signed by both Elnita Maxwellheryl and Dr. Laural BenesJohnson, and placed up front for patient pick up. Patient notified.

## 2015-08-07 NOTE — Telephone Encounter (Signed)
Patient called and wanted me to return her call. I called the patient back and she stated that the letter we typed for her was not good specific enough for the gym. She would like a new one to be typed and have a doctor signature. Will put [facility] and Dr. Laural BenesJohnson on there since she is Cheryl's collaborating physician.

## 2015-08-11 ENCOUNTER — Other Ambulatory Visit: Payer: Self-pay | Admitting: Unknown Physician Specialty

## 2015-08-12 ENCOUNTER — Ambulatory Visit
Admission: RE | Admit: 2015-08-12 | Discharge: 2015-08-12 | Disposition: A | Discharge status: Home / Self Care | Payer: BLUE CROSS/BLUE SHIELD | Source: Ambulatory Visit | Attending: Unknown Physician Specialty | Admitting: Unknown Physician Specialty

## 2015-08-12 ENCOUNTER — Ambulatory Visit: Payer: BLUE CROSS/BLUE SHIELD | Admitting: Unknown Physician Specialty

## 2015-08-12 DIAGNOSIS — H02402 Unspecified ptosis of left eyelid: Secondary | ICD-10-CM | POA: Diagnosis present

## 2015-08-12 DIAGNOSIS — R29810 Facial weakness: Secondary | ICD-10-CM | POA: Diagnosis present

## 2015-08-12 DIAGNOSIS — T17308A Unspecified foreign body in larynx causing other injury, initial encounter: Secondary | ICD-10-CM

## 2015-08-12 LAB — POCT I-STAT CREATININE: Creatinine, Ser: 1 mg/dL (ref 0.44–1.00)

## 2015-08-12 MED ORDER — GADOBENATE DIMEGLUMINE 529 MG/ML IV SOLN
15.0000 mL | Freq: Once | INTRAVENOUS | Status: AC | PRN
  Administered 2015-08-12: 12 mL via INTRAVENOUS

## 2015-08-14 ENCOUNTER — Ambulatory Visit: Payer: Self-pay | Admitting: Unknown Physician Specialty

## 2015-09-15 ENCOUNTER — Other Ambulatory Visit: Payer: Self-pay | Admitting: Unknown Physician Specialty

## 2015-09-16 ENCOUNTER — Other Ambulatory Visit: Payer: Self-pay | Admitting: Unknown Physician Specialty

## 2015-09-17 NOTE — Telephone Encounter (Signed)
[facility] was sent in December. They stated that they could not find the patient's account. So I tried calling the patient to see if I could find out what was going on. There was no answer and was unable to leave a voicemail so I will try to call again later.

## 2015-09-17 NOTE — Telephone Encounter (Signed)
She got a years supply of her medicine in December and so shouldn't be due. She should be seen before that runs out and just no-showed an appointment.

## 2015-09-18 NOTE — Telephone Encounter (Signed)
Called and spoke to patient. She states that since her insurance changed they are no longer using [facility]. [facility] [place] [facility]. Patient also wanted to schedule an appointment so I scheduled her a 30 minute f/up for 09/25/15. Will route back to provider for medication approval.

## 2015-09-23 ENCOUNTER — Encounter: Payer: Self-pay | Admitting: *Deleted

## 2015-09-23 ENCOUNTER — Ambulatory Visit (INDEPENDENT_AMBULATORY_CARE_PROVIDER_SITE_OTHER): Payer: BLUE CROSS/BLUE SHIELD

## 2015-09-23 ENCOUNTER — Ambulatory Visit
Admission: EM | Admit: 2015-09-23 | Discharge: 2015-09-23 | Disposition: A | Discharge status: Home / Self Care | Payer: BLUE CROSS/BLUE SHIELD | Attending: Family Medicine | Admitting: Family Medicine

## 2015-09-23 DIAGNOSIS — T148 Other injury of unspecified body region: Secondary | ICD-10-CM | POA: Diagnosis not present

## 2015-09-23 DIAGNOSIS — IMO0002 Reserved for concepts with insufficient information to code with codable children: Secondary | ICD-10-CM

## 2015-09-23 DIAGNOSIS — S6990XA Unspecified injury of unspecified wrist, hand and finger(s), initial encounter: Secondary | ICD-10-CM

## 2015-09-23 DIAGNOSIS — Z23 Encounter for immunization: Secondary | ICD-10-CM

## 2015-09-23 MED ORDER — TETANUS-DIPHTH-ACELL PERTUSSIS 5-2.5-18.5 LF-MCG/0.5 IM SUSP
0.5000 mL | Freq: Once | INTRAMUSCULAR | Status: AC
  Administered 2015-09-23: 0.5 mL via INTRAMUSCULAR

## 2015-09-23 MED ORDER — DOXYCYCLINE HYCLATE 100 MG PO CAPS: 100.0000 mg | ORAL_CAPSULE | Freq: Two times a day (BID) | ORAL | 0 refills | Status: DC

## 2015-09-23 NOTE — ED Provider Notes (Signed)
[facility]    CSN: [id number] Arrival date & time: 09/23/15  1644  First Provider Contact:  None       History   Chief Complaint Chief Complaint  Patient presents with  . Finger Injury    HPI Alyssa Zuniga is a 50 y.o. female.   HPI: Patient states that she had a car door hit her right fourth digit 4 days ago. She did not see a physician after the injury. She was treating the laceration with hydrogen peroxide. Her significant other convinced her to come in today to make sure the wound is not getting infected. Patient states that she has some decreased range of motion due to the swelling of the digit. She denies any other injury. She denies any fever or chills. She does not recall when her last tetanus immunization was.  Past Medical History:  Diagnosis Date  . Anxiety   . 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 Chronic pain    lupus, fibromyalgia  . Depression   . Fibromyalgia   . GERD (gastroesophageal reflux disease)   . Headache, common migraine   . Hypertension   . Hypothyroidism   . IBS (irritable bowel syndrome)   . Kidney stones   . Migraines    2-3x/wk  . Occasional tremors     Patient Active Problem List   Diagnosis Date Noted  . Headache disorder 06/24/2015  . Noninfectious diarrhea   . Heartburn   . Diarrhea   . Gastritis   . Arthritis, degenerative 05/28/2015  . Adaptive colitis 05/28/2015  . Hypertension 04/21/2015  . Depression 04/21/2015  . Tremors of nervous system 01/14/2015  . Incontinence of bowel 01/14/2015  . Incontinence of urine 01/14/2015  . Fibromyalgia 12/15/2014  . Chronic pain 12/15/2014  . Vitamin D deficiency 12/15/2014  . Frozen shoulder 12/15/2014  . Hyperemesis 12/15/2014  . Hypothyroidism 11/05/2014  . Diaphoresis 11/05/2014  . Memory loss 11/05/2014  . Hypercholesteremia 11/05/2014  . Migraines 11/05/2014  . Insomnia 11/05/2014  . Atypical chest pain 07/07/2014  . Peripheral nerve  disease (HCC) 03/06/2014  . Cephalalgia 10/02/2013  . Anxiety 05/07/2013  . Combined fat and carbohydrate induced hyperlipemia 04/06/2013  . Carpal tunnel syndrome 02/03/2013  . Neuropathy (HCC) 10/29/2012  . Acid reflux 10/29/2012  . Back ache 08/10/2012  . Benign hypertension 09/25/2008    Past Surgical History:  Procedure Laterality Date  . ABDOMINAL HYSTERECTOMY    . bunion removal Right   . CARDIAC CATHETERIZATION  07/05/14   [facility]  . CESAREAN SECTION    . COLONOSCOPY WITH PROPOFOL N/A 06/15/2015   Procedure: COLONOSCOPY WITH PROPOFOL;  Surgeon: Midge Minium, MD;  [facility];  Service: Endoscopy;  Laterality: N/A;  . ESOPHAGOGASTRODUODENOSCOPY (EGD) WITH PROPOFOL N/A 06/15/2015   Procedure: ESOPHAGOGASTRODUODENOSCOPY (EGD) WITH PROPOFOL;  Surgeon: Midge Minium, MD;  Location: [facility];  Service: Endoscopy;  Laterality: N/A;    OB History    No data available       Home Medications    Prior to Admission medications   Medication Sig Start Date End Date Taking? Authorizing Provider  amitriptyline (ELAVIL) 25 MG tablet Take 2 tablets (50 mg total) by mouth at bedtime. 07/10/15  Yes Gabriel Cirri, NP  atorvastatin (LIPITOR) 40 MG tablet Take 1 tablet (40 mg total) by mouth daily. 01/29/15 01/29/16 Yes Gabriel Cirri, NP  calcium carbonate (TUMS EX) 750 MG chewable tablet Chew 1 tablet  by mouth daily.   Yes Historical Provider, MD  Cholecalciferol (VITAMIN D-3 PO) Take 2,000 Units by mouth daily.   Yes Historical Provider, MD  Cyanocobalamin (VITAMIN B-12) 500 MCG LOZG Take by mouth daily.   Yes Historical Provider, MD  dexlansoprazole (DEXILANT) 60 MG capsule Take 60 mg by mouth daily.   Yes Historical Provider, MD  estradiol (ESTRACE) 1 MG tablet Take 1 tablet (1 mg total) by mouth daily. 02/13/15  Yes Gabriel Cirri, NP  GARCINIA CAMBOGIA-CHROMIUM PO Take 1,600 mg by mouth daily.   Yes Historical Provider, MD  levothyroxine (SYNTHROID, LEVOTHROID) 112 MCG  tablet TAKE 1 TABLET BY MOUTH EVERY DAY ON AN EMPTY STOMACH 09/18/15  Yes Gabriel Cirri, NP  Magnesium 250 MG TABS Take by mouth daily.   Yes Historical Provider, MD  Multiple Vitamins-Minerals (MULTI + OMEGA-3 ADULT GUMMIES PO) Take by mouth daily.   Yes Historical Provider, MD  ondansetron (ZOFRAN) 8 MG tablet Take 8 mg by mouth every 8 (eight) hours as needed for nausea or vomiting.   Yes Historical Provider, MD  oxyCODONE (OXYCONTIN) 20 mg 12 hr tablet Take 20 mg by mouth every 12 (twelve) hours.   Yes Historical Provider, MD  Oxycodone HCl 10 MG TABS Take 10 mg by mouth daily.   Yes Historical Provider, MD  potassium chloride SA (K-DUR,KLOR-CON) 20 MEQ tablet Take 1 tablet (20 mEq total) by mouth daily. 01/29/15 01/29/16 Yes Gabriel Cirri, NP  pregabalin (LYRICA) 100 MG capsule Take 100 mg by mouth 3 (three) times daily.   Yes Historical Provider, MD  promethazine (PHENERGAN) 25 MG tablet Take 1 tablet (25 mg total) by mouth every 8 (eight) hours as needed for nausea or vomiting. 05/04/15  Yes Gabriel Cirri, NP  topiramate (TOPAMAX) 50 MG tablet TAKE 1 TABLET BY MOUTH TWICE DAILY 09/15/15  Yes Gabriel Cirri, NP  triamterene-hydrochlorothiazide (DYAZIDE) 37.5-25 MG capsule Take 1 each (1 capsule total) by mouth daily. 07/29/15  Yes Gabriel Cirri, NP  Turmeric Curcumin 500 MG CAPS Take 500 mg by mouth daily.   Yes Historical Provider, MD  doxycycline (VIBRAMYCIN) 100 MG capsule Take 1 capsule (100 mg total) by mouth 2 (two) times daily. 09/23/15   Jolene Provost, MD  hydrochlorothiazide (HYDRODIURIL) 25 MG tablet Take 1 tablet (25 mg total) by mouth daily. 06/24/15   Gabriel Cirri, NP  nitrofurantoin, macrocrystal-monohydrate, (MACROBID) 100 MG capsule Take 1 capsule (100 mg total) by mouth 2 (two) times daily. 06/24/15   Gabriel Cirri, NP  oxyCODONE (ROXICODONE) 15 MG immediate release tablet Take by mouth every 6 (six) hours as needed. 04/13/14   Historical Provider, MD  phenazopyridine (PYRIDIUM) 200 MG  tablet Take 1 tablet (200 mg total) by mouth 3 (three) times daily as needed for pain. 02/23/15   Sharman Cheek, MD  tamsulosin (FLOMAX) 0.4 MG CAPS capsule Take 1 capsule (0.4 mg total) by mouth daily. 02/23/15   Sharman Cheek, MD    Family History Family History  Problem Relation Age of Onset  . Depression Mother   . Anxiety disorder Mother   . Cancer Mother     breast  . Migraines Mother   . Stroke Father   . Heart disease Father     MI  . Alcohol abuse Father   . Arthritis Father   . Migraines Father   . Thyroid disease Father   . Thyroid disease Maternal Grandmother   . Hyperlipidemia Maternal Grandmother   . Migraines Brother   . Anxiety disorder Brother   .  Heart disease Maternal Grandfather     MI  . Hyperlipidemia Paternal Grandmother   . Thyroid disease Paternal Grandmother   . Heart disease Paternal Grandfather     MI  . Migraines Daughter   . Migraines Son   . Migraines Son   . Migraines Daughter     Social History Social History  Substance Use Topics  . Smoking status: Never Smoker  . Smokeless tobacco: Never Used  . Alcohol use No     Allergies   Penicillins and Sulfa antibiotics   Review of Systems Review of Systems   Physical Exam Triage Vital Signs ED Triage Vitals  Enc Vitals Group     BP 09/23/15 1702 (!) 119/91     Pulse Rate 09/23/15 1702 83     Resp 09/23/15 1702 18     Temp 09/23/15 1702 98.2 F (36.8 C)     Temp Source 09/23/15 1702 Oral     SpO2 09/23/15 1702 99 %     Weight 09/23/15 1703 138 lb (62.6 kg)     Height 09/23/15 1703 5\' 2"  (1.575 m)     Head Circumference --      Peak Flow --      Pain Score 09/23/15 1712 4     Pain Loc --      Pain Edu? --      Excl. in GC? --    No data found.   Updated Vital Signs BP (!) 119/91 (BP Location: Right Arm)   Pulse 83   Temp 98.2 F (36.8 C) (Oral)   Resp 18   Ht 5\' 2"  (1.575 m)   Wt 138 lb (62.6 kg)   LMP  (LMP Unknown)   SpO2 99%   BMI 25.24 kg/m       Physical Exam:  GENERAL: NAD RESP: CTA B CARD: RRR MSK: Palmar aspect of right fourth digit with laceration along middle phalanx, the wound is open, there is no discharge from the site appreciated, there is minimal erythema around the site, no streaks appreciated, there does not appear to be any tenderness of the bone, limited flexion due to swelling of the area, NV intact NEURO: CN II-XII groslly intact    UC Treatments / Results  Labs (all labs ordered are listed, but only abnormal results are displayed) Labs Reviewed - No data to display  EKG  EKG Interpretation None       Radiology Dg Finger Ring Right  Result Date: 09/23/2015 CLINICAL DATA:  Laceration to distal phalanx of right ring finger with swelling four days. EXAM: RIGHT RING FINGER 2+V COMPARISON:  None. FINDINGS: Subtle soft tissue defect over the soft tissues adjacent the fourth distal phalanx compatible with laceration. Underlying bony structures are within normal. IMPRESSION: No acute fracture. Electronically Signed   By: Elberta Fortisaniel  Boyle M.D.   On: 09/23/2015 17:55    Procedures Procedures (including critical care time)  Medications Ordered in UC Medications - No data to display   Initial Impression / Assessment and Plan / UC Course  I have reviewed the triage vital signs and the nursing notes.  Pertinent labs & imaging results that were available during my care of the patient were reviewed by me and considered in my medical decision making (see chart for details).  Clinical Course   A/P: Right fourth digit laceration - given the fact that the laceration happened 4 days ago cannot close the wound with sutures, patient put on doxycycline given allergies to other antibiotics,  she has a appointment with her primary care physician on Friday, will splint her finger for comfort and to help with healing, if any acute problems seek medical attention as discussed. TDAP immunization given.  Final Clinical  Impressions(s) / UC Diagnoses   Final diagnoses:  Finger injury, unspecified laterality, initial encounter  Laceration    New Prescriptions New Prescriptions   DOXYCYCLINE (VIBRAMYCIN) 100 MG CAPSULE    Take 1 capsule (100 mg total) by mouth 2 (two) times daily.     Jolene ProvostKirtida Onnie Alatorre, MD 09/23/15 [phone]

## 2015-09-23 NOTE — Discharge Instructions (Signed)
Recommend follow-up with primary care physician this week. Discussed with patient that she should keep the area clean and dry and monitor for any worsening symptoms of infection. She can wear the finger splint for now to help with healing. Over-the-counter pain medication when necessary.

## 2015-09-23 NOTE — ED Triage Notes (Signed)
Patient lacerated her right ring finger 4 days ago when she closed it in a car door. Laceration has begun to close and is healing with no redness drainage or fever.

## 2015-09-24 ENCOUNTER — Ambulatory Visit (INDEPENDENT_AMBULATORY_CARE_PROVIDER_SITE_OTHER): Payer: BLUE CROSS/BLUE SHIELD | Admitting: Neurology

## 2015-09-24 ENCOUNTER — Encounter: Payer: Self-pay | Admitting: Neurology

## 2015-09-24 VITALS — BP 128/90 | HR 83 | Ht 62.0 in | Wt 130.0 lb

## 2015-09-24 DIAGNOSIS — R413 Other amnesia: Secondary | ICD-10-CM | POA: Diagnosis not present

## 2015-09-24 DIAGNOSIS — G43009 Migraine without aura, not intractable, without status migrainosus: Secondary | ICD-10-CM

## 2015-09-24 DIAGNOSIS — R32 Unspecified urinary incontinence: Secondary | ICD-10-CM

## 2015-09-24 DIAGNOSIS — R202 Paresthesia of skin: Secondary | ICD-10-CM

## 2015-09-24 DIAGNOSIS — R159 Full incontinence of feces: Secondary | ICD-10-CM

## 2015-09-24 MED ORDER — AMITRIPTYLINE HCL 100 MG PO TABS: 100.0000 mg | ORAL_TABLET | Freq: Every day | ORAL | 2 refills | Status: DC

## 2015-09-24 MED ORDER — SUMATRIPTAN SUCCINATE 100 MG PO TABS: ORAL_TABLET | ORAL | 2 refills | Status: DC

## 2015-09-24 NOTE — Progress Notes (Signed)
NEUROLOGY CONSULTATION NOTE  Alyssa Zuniga MRN: [id number] DOB: 1965/08/26  Referring provider: Gabriel Cirri, NP Primary care provider: Gabriel Cirri, NP  Reason for consult:  Headache, confusion, paresthesia  HISTORY OF PRESENT ILLNESS: Alyssa Zuniga is a 50 year old right-handed woman with hypothyroidism, fibromyagia, who presents for headache, paresthesias, and confusion.   She has had migraines for several years.  They are 10/10 right sided pounding pain, associated with nausea, vomiting, photophobia, phonophobia and blurred vision.  They last anywhere from several hours to 1 to 2 days.  They occur about once a week.  She takes oxycontin usually.  She takes topiramate 50mg  twice daily (for headache), amitriptyline 50mg  (for sleep), Lyrica (for fibromyalgia), magnesium 250mg  and turmeric.  She also takes oxycontin daily and oxycodone as needed. In the past, she has taken sumatriptan, which was stopped because of polypharmacy.  She has several year history of numbness and tingling of upper and lower extremities, more pronounced in right leg.  She does have fibromyalgia with chronic back pain.  For several years, she has had both bowel and bladder incontinence.  She has had a negative workup from GI.  She has an upcoming appointment with urology.  She has had an MRI of the lumbar spine on 06/18/14 which showed small disc herniation impinging the left L5 nerve root, but no evidence of cauda equina syndrome.  MRI of thoracic spine from 08/22/12 showed small right paracentral disc protrusion at T9-10, but overall unremarkable.    She has confusion and memory problems for several years.  She reports short-term memory problems and frequently forgets things such as recent appointments.  She sometimes forgets how to perform daily tasks.  She still drives and on some occasions gets disoriented on familiar routes.  It may take her some time to remember the name of familiar people.  Other somatic  complaints include problems with balance and dysphagia.  More recently, she had an MRI of the brain with and without contrast 08/12/15, which was personally reviewed and was normal.  She was more recently evaluated by rheumatology and is being worked up for an inflammatory arthritis.  Labs from March include normal CBC and CMP except for mildly elevated ALT of 43.  TSH from October was 1.480 and B12 was 517.  She does have history of recurrent kidney stones.  She reports getting them twice this past year.  PAST MEDICAL HISTORY: Past Medical History:  Diagnosis Date  . Anxiety   . 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 Chronic pain    lupus, fibromyalgia  . Depression   . Fibromyalgia   . GERD (gastroesophageal reflux disease)   . Headache, common migraine   . Hypertension   . Hypothyroidism   . IBS (irritable bowel syndrome)   . Kidney stones   . Migraines    2-3x/wk  . Occasional tremors     PAST SURGICAL HISTORY: Past Surgical History:  Procedure Laterality Date  . ABDOMINAL HYSTERECTOMY    . bunion removal Right   . CARDIAC CATHETERIZATION  07/05/14   [facility]  . CESAREAN SECTION    . COLONOSCOPY WITH PROPOFOL N/A 06/15/2015   Procedure: COLONOSCOPY WITH PROPOFOL;  Surgeon: Midge Minium, MD;  Location: [facility];  Service: Endoscopy;  Laterality: N/A;  . ESOPHAGOGASTRODUODENOSCOPY (EGD) WITH PROPOFOL N/A 06/15/2015   Procedure: ESOPHAGOGASTRODUODENOSCOPY (EGD) WITH PROPOFOL;  Surgeon: Midge Minium, MD;  Location: [facility];  Service: Endoscopy;  Laterality: N/A;    MEDICATIONS: Current Outpatient Prescriptions on File Prior to Visit  Medication Sig Dispense Refill  . atorvastatin (LIPITOR) 40 MG tablet Take 1 tablet (40 mg total) by mouth daily. 90 tablet 3  . calcium carbonate (TUMS EX) 750 MG chewable tablet Chew 1 tablet by mouth daily.    . Cholecalciferol (VITAMIN D-3 PO) Take 2,000 Units by mouth daily.    . Cyanocobalamin  (VITAMIN B-12) 500 MCG LOZG Take by mouth daily.    Marland Kitchen dexlansoprazole (DEXILANT) 60 MG capsule Take 60 mg by mouth daily.    Marland Kitchen doxycycline (VIBRAMYCIN) 100 MG capsule Take 1 capsule (100 mg total) by mouth 2 (two) times daily. 20 capsule 0  . estradiol (ESTRACE) 1 MG tablet Take 1 tablet (1 mg total) by mouth daily. 30 tablet 0  . GARCINIA CAMBOGIA-CHROMIUM PO Take 1,600 mg by mouth daily.    . hydrochlorothiazide (HYDRODIURIL) 25 MG tablet Take 1 tablet (25 mg total) by mouth daily. 90 tablet 3  . levothyroxine (SYNTHROID, LEVOTHROID) 112 MCG tablet TAKE 1 TABLET BY MOUTH EVERY DAY ON AN EMPTY STOMACH 30 tablet 0  . Magnesium 250 MG TABS Take by mouth daily.    . Multiple Vitamins-Minerals (MULTI + OMEGA-3 ADULT GUMMIES PO) Take by mouth daily.    . nitrofurantoin, macrocrystal-monohydrate, (MACROBID) 100 MG capsule Take 1 capsule (100 mg total) by mouth 2 (two) times daily. 14 capsule 0  . ondansetron (ZOFRAN) 8 MG tablet Take 8 mg by mouth every 8 (eight) hours as needed for nausea or vomiting.    Marland Kitchen oxyCODONE (OXYCONTIN) 20 mg 12 hr tablet Take 20 mg by mouth every 12 (twelve) hours.    Marland Kitchen oxyCODONE (ROXICODONE) 15 MG immediate release tablet Take by mouth every 6 (six) hours as needed.    . Oxycodone HCl 10 MG TABS Take 10 mg by mouth daily.    . phenazopyridine (PYRIDIUM) 200 MG tablet Take 1 tablet (200 mg total) by mouth 3 (three) times daily as needed for pain. 10 tablet 0  . potassium chloride SA (K-DUR,KLOR-CON) 20 MEQ tablet Take 1 tablet (20 mEq total) by mouth daily. 90 tablet 3  . pregabalin (LYRICA) 100 MG capsule Take 100 mg by mouth 3 (three) times daily.    . promethazine (PHENERGAN) 25 MG tablet Take 1 tablet (25 mg total) by mouth every 8 (eight) hours as needed for nausea or vomiting. 20 tablet 0  . tamsulosin (FLOMAX) 0.4 MG CAPS capsule Take 1 capsule (0.4 mg total) by mouth daily. 30 capsule 0  . triamterene-hydrochlorothiazide (DYAZIDE) 37.5-25 MG capsule Take 1 each (1  capsule total) by mouth daily. 30 capsule 1  . Turmeric Curcumin 500 MG CAPS Take 500 mg by mouth daily.     No current facility-administered medications on file prior to visit.     ALLERGIES: Allergies  Allergen Reactions  . Sulfa Antibiotics Nausea And Vomiting and Diarrhea    Other reaction(s): Vomiting  . Penicillins Rash    Other reaction(s): Unknown Pt not sure of the reaction, it was a childhood allergy.    FAMILY HISTORY: Family History  Problem Relation Age of Onset  . Depression Mother   . Anxiety disorder Mother   . Cancer Mother     breast  . Migraines Mother   . Stroke Father   . Heart disease Father     MI  . Alcohol abuse Father   . Arthritis Father   . Migraines Father   .  Thyroid disease Father   . Thyroid disease Maternal Grandmother   . Hyperlipidemia Maternal Grandmother   . Migraines Brother   . Anxiety disorder Brother   . Heart disease Maternal Grandfather     MI  . Hyperlipidemia Paternal Grandmother   . Thyroid disease Paternal Grandmother   . Heart disease Paternal Grandfather     MI  . Migraines Daughter   . Migraines Son   . Migraines Son   . Migraines Daughter     SOCIAL HISTORY: Social History   Social History  . Marital status: Married    Spouse name: N/A  . Number of children: N/A  . Years of education: N/A   Occupational History  . Not on file.   Social History Main Topics  . Smoking status: Never Smoker  . Smokeless tobacco: Never Used  . Alcohol use No  . Drug use: No  . Sexual activity: Yes    Birth control/ protection: Surgical   Other Topics Concern  . Not on file   Social History Narrative  . No narrative on file    REVIEW OF SYSTEMS: Constitutional: No fevers, chills, or sweats, no generalized fatigue, change in appetite Eyes: No visual changes, double vision, eye pain Ear, nose and throat: No hearing loss, ear pain, nasal congestion, sore throat Cardiovascular: No chest pain,  palpitations Respiratory:  No shortness of breath at rest or with exertion, wheezes GastrointestinaI: as above Genitourinary:  As above Musculoskeletal:  As above Integumentary: No rash, pruritus, skin lesions Neurological: as above Psychiatric: depression, insomnia, anxiety Endocrine: No palpitations, fatigue, diaphoresis, mood swings, change in appetite, change in weight, increased thirst Hematologic/Lymphatic:  No purpura, petechiae. Allergic/Immunologic: no itchy/runny eyes, nasal congestion, recent allergic reactions, rashes  PHYSICAL EXAM: Vitals:   09/24/15 1450  BP: 128/90  Pulse: 83   General: No acute distress.  Patient appears well-groomed.  Head:  Normocephalic/atraumatic Eyes:  fundi examined but not visualized Neck: supple, no paraspinal tenderness, full range of motion Back: No paraspinal tenderness Heart: regular rate and rhythm Lungs: Clear to auscultation bilaterally. Vascular: No carotid bruits. Neurological Exam: Mental status: alert and oriented to person, place, and time, delayed recall 1/3,  remote memory intact, fund of knowledge intact, attention and concentration intact, speech fluent and not dysarthric, language intact. MMSE - Mini Mental State Exam 09/24/2015  Orientation to time 4  Orientation to Place 3  Registration 3  Attention/ Calculation 5  Recall 1  Language- name 2 objects 2  Language- repeat 1  Language- follow 3 step command 2  Language- read & follow direction 1  Write a sentence 0  Copy design 1  Total score 23   Cranial nerves: CN I: not tested CN II: pupils equal, round and reactive to light, visual fields intact CN III, IV, VI:  full range of motion, no nystagmus, no ptosis CN V: facial sensation intact CN VII: upper and lower face symmetric CN VIII: hearing intact CN IX, X: gag intact, uvula midline CN XI: sternocleidomastoid and trapezius muscles intact CN XII: tongue midline Bulk & Tone: normal, no  fasciculations. Motor:  5/5 throughout.  NO TREMORS APPRECIATED Sensation: temperature and vibration sensation intact. Deep Tendon Reflexes:  2+ throughout, toes downgoing. Finger to nose testing:  Without dysmetria.  Heel to shin:  Without dysmetria.  Gait:  Normal station and stride.  Able to turn and tandem walk. Romberg negative.  IMPRESSION: 1.  Migraine 2.  Memory deficits:  May be related to polypharmacy or  depression.  I don't suspect cognitive impairment at this time but further workup is warrranted. 3.  Paresthesias 4.  Bowel and bladder incontinence:  I have no neurologic explanation for this.  Prior imaging is unrevealing. Psychosomatic etiology is in the differential if workup is negative.  PLAN: 1.  We will stop topamax as it may be contributing to memory problems and recurrent kidney stones.  Instead, we will increase amitriptyline to 100mg  at bedtime. 2.  Sumatriptan 100mg  for abortive therapy.  She uses Phenergan and Zofran for nausea. 3.  NCV-EMG to assess for neuropathy 4.  Neuropsychological testing to evaluate cognition 5.  Follow up  Thank you for allowing me to take part in the care of this patient.  Shon MilletAdam Roger Fasnacht, DO  CC:  Gabriel Cirriheryl Wicker, NP

## 2015-09-24 NOTE — Patient Instructions (Signed)
1.  Stop Topamax.  It causes memory problems and kidney stones 2.  Increase amitriptyline to 100mg  at bedtime.  Hopefully, it will help you sleep and help with the migraines 3.  At earliest onset of migraine, take sumatriptan 100mg .  May repeat dose once in 2 hours if needed.  Do not exceed 2 tablets in 24 hours. 4.  Will send you for neuropsychological testing for memory 5.  Will order nerve study of right upper and lower extremities. 6.  Follow up in  3 months or after memory testing

## 2015-09-25 ENCOUNTER — Ambulatory Visit (INDEPENDENT_AMBULATORY_CARE_PROVIDER_SITE_OTHER): Payer: BLUE CROSS/BLUE SHIELD | Admitting: Unknown Physician Specialty

## 2015-09-25 ENCOUNTER — Encounter: Payer: Self-pay | Admitting: Unknown Physician Specialty

## 2015-09-25 VITALS — BP 158/126 | HR 73 | Temp 98.2°F | Ht 61.6 in | Wt 132.2 lb

## 2015-09-25 DIAGNOSIS — R159 Full incontinence of feces: Secondary | ICD-10-CM

## 2015-09-25 DIAGNOSIS — I10 Essential (primary) hypertension: Secondary | ICD-10-CM

## 2015-09-25 DIAGNOSIS — N941 Unspecified dyspareunia: Secondary | ICD-10-CM | POA: Diagnosis not present

## 2015-09-25 DIAGNOSIS — T148 Other injury of unspecified body region: Secondary | ICD-10-CM

## 2015-09-25 DIAGNOSIS — R32 Unspecified urinary incontinence: Secondary | ICD-10-CM

## 2015-09-25 DIAGNOSIS — IMO0002 Reserved for concepts with insufficient information to code with codable children: Secondary | ICD-10-CM

## 2015-09-25 DIAGNOSIS — G43109 Migraine with aura, not intractable, without status migrainosus: Secondary | ICD-10-CM

## 2015-09-25 NOTE — Assessment & Plan Note (Signed)
Per neurology 

## 2015-09-25 NOTE — Progress Notes (Signed)
BP (!) 158/126 (BP Location: Left Arm, Cuff Size: Normal)   Pulse 73   Temp 98.2 F (36.8 C)   Ht 5' 1.6" (1.565 m)   Wt 132 lb 3.2 oz (60 kg)   LMP  (LMP Unknown)   SpO2 99%   BMI 24.49 kg/m    Subjective:    Patient ID: Alyssa Zuniga, female    DOB: 11-Sep-1965, 50 y.o.   MRN: [id number]  HPI: Alyssa Zuniga is a 50 y.o. female  Chief Complaint  Patient presents with  . Depression  . Migraine  . Pain  . Laceration    finger on right hand    Depression Seeing Dr. Maryruth BunKapur.  Having a lot of stress at home  Migraine Per neurology  Bladder and bowel incontinence Per neurology  Laceration Happened 5 days ago after slamming in a car door.  [facility] but unable to get stitches  Hypertension Pt taking Dyazide but notes her finger is "throbbing now."  She thinks she is doing well when not in pain and having a lot of stress as discussed above.  She does have a BP cuff at home but doesn't check it.    Relevant past medical, surgical, family and social history reviewed and updated as indicated. Interim medical history since our last visit reviewed. Allergies and medications reviewed and updated.  Review of Systems  Genitourinary: Positive for dyspareunia.    Per HPI unless specifically indicated above     Objective:    BP (!) 158/126 (BP Location: Left Arm, Cuff Size: Normal)   Pulse 73   Temp 98.2 F (36.8 C)   Ht 5' 1.6" (1.565 m)   Wt 132 lb 3.2 oz (60 kg)   LMP  (LMP Unknown)   SpO2 99%   BMI 24.49 kg/m   Wt Readings from Last 3 Encounters:  09/25/15 132 lb 3.2 oz (60 kg)  09/24/15 130 lb (59 kg)  09/23/15 138 lb (62.6 kg)    Physical Exam  Constitutional: She is oriented to person, place, and time. She appears well-developed and well-nourished. No distress.  HENT:  Head: Normocephalic and atraumatic.  Eyes: Conjunctivae and lids are normal. Right eye exhibits no discharge. Left eye exhibits no discharge. No scleral icterus.    Neck: Normal range of motion. Neck supple. No JVD present. Carotid bruit is not present.  Cardiovascular: Normal rate, regular rhythm and normal heart sounds.   Pulmonary/Chest: Effort normal and breath sounds normal.  Abdominal: Normal appearance. There is no splenomegaly or hepatomegaly.  Musculoskeletal: Normal range of motion.  Neurological: She is alert and oriented to person, place, and time.  Skin: Skin is warm, dry and intact. No rash noted. No pallor.  Psychiatric: She has a normal mood and affect. Her behavior is normal. Judgment and thought content normal.    Results for orders placed or performed during the hospital encounter of 08/12/15  I-STAT creatinine  Result Value Ref Range   Creatinine, Ser 1.00 0.44 - 1.00 mg/dL      Assessment & Plan:   Problem List Items Addressed This Visit      Unprioritized   Dyspareunia in female    Not now, but discussed seeing physical therapy      Hypertension    Continue Dyazide.  Encouraged to check BP at home.  Recheck in 4 weeks when finger not bothering her      Incontinence of bowel    Per  neurology      Incontinence of urine    Per neurology      Migraines    Per Neurology       Other Visit Diagnoses    Laceration    -  Primary   continue present care.  No infection noted       Follow up plan: Return in about 4 weeks (around 10/23/2015). For BP

## 2015-09-25 NOTE — Assessment & Plan Note (Signed)
Continue Dyazide.  Encouraged to check BP at home.  Recheck in 4 weeks when finger not bothering her

## 2015-09-25 NOTE — Assessment & Plan Note (Signed)
Not now, but discussed seeing physical therapy

## 2015-09-25 NOTE — Assessment & Plan Note (Signed)
-   Per Neurology

## 2015-09-26 ENCOUNTER — Ambulatory Visit (INDEPENDENT_AMBULATORY_CARE_PROVIDER_SITE_OTHER): Payer: BLUE CROSS/BLUE SHIELD

## 2015-09-26 ENCOUNTER — Encounter: Payer: Self-pay | Admitting: Gynecology

## 2015-09-26 ENCOUNTER — Ambulatory Visit
Admission: EM | Admit: 2015-09-26 | Discharge: 2015-09-26 | Disposition: A | Discharge status: Home / Self Care | Payer: BLUE CROSS/BLUE SHIELD | Attending: Family Medicine | Admitting: Family Medicine

## 2015-09-26 DIAGNOSIS — S63502A Unspecified sprain of left wrist, initial encounter: Secondary | ICD-10-CM

## 2015-09-26 DIAGNOSIS — M7582 Other shoulder lesions, left shoulder: Secondary | ICD-10-CM

## 2015-09-26 DIAGNOSIS — W19XXXA Unspecified fall, initial encounter: Secondary | ICD-10-CM

## 2015-09-26 NOTE — ED Triage Notes (Signed)
Per patient fell x yesterday around 6 pm at her storage unit. Per patient now with left shoulder / wrist and hip pain.

## 2015-09-26 NOTE — ED Provider Notes (Signed)
CSN: [id number]     Arrival date & time 09/26/15  [id number] History   First MD Initiated Contact with Patient 09/26/15 1025     Chief Complaint  Patient presents with  . Fall   (Consider location/radiation/quality/duration/timing/severity/associated sxs/prior Treatment) HPI the patient presents today for evaluation of left-sided pain after a fall yesterday. The patient was in her storage closet at her home when she tripped over a crate and got "wedged" between the crate and the wall. She presents today with generalized left-sided pain, she does have a history of chronic low back pain as well as fibromyalgia. She reports the most pain in her left wrist with swelling as well as left shoulder with decreased range of motion. Denies any surgical history to left wrist left shoulder. She denies any increase in her radicular symptoms to her left upper extremity.  She denies any loss of consciousness. Past Medical History:  Diagnosis Date  . Anxiety   . 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 Chronic pain    lupus, fibromyalgia  . Depression   . Fibromyalgia   . GERD (gastroesophageal reflux disease)   . Headache, common migraine   . Hypertension   . Hypothyroidism   . IBS (irritable bowel syndrome)   . Kidney stones   . Manic depression (HCC)   . Migraines    2-3x/wk  . Occasional tremors    Past Surgical History:  Procedure Laterality Date  . ABDOMINAL HYSTERECTOMY    . bunion removal Right   . CARDIAC CATHETERIZATION  07/05/14   [facility]  . CESAREAN SECTION    . COLONOSCOPY WITH PROPOFOL N/A 06/15/2015   Procedure: COLONOSCOPY WITH PROPOFOL;  Surgeon: Midge Miniumarren Wohl, MD;  Location: [facility];  Service: Endoscopy;  Laterality: N/A;  . ESOPHAGOGASTRODUODENOSCOPY (EGD) WITH PROPOFOL N/A 06/15/2015   Procedure: ESOPHAGOGASTRODUODENOSCOPY (EGD) WITH PROPOFOL;  Surgeon: Midge Miniumarren Wohl, MD;  [facility];  Service: Endoscopy;  Laterality: N/A;   Family History   Problem Relation Age of Onset  . Depression Mother   . Anxiety disorder Mother   . Cancer Mother     breast  . Migraines Mother   . Stroke Father   . Heart disease Father     MI  . Alcohol abuse Father   . Arthritis Father   . Migraines Father   . Thyroid disease Father   . Thyroid disease Maternal Grandmother   . Hyperlipidemia Maternal Grandmother   . Migraines Brother   . Anxiety disorder Brother   . Heart disease Maternal Grandfather     MI  . Hyperlipidemia Paternal Grandmother   . Thyroid disease Paternal Grandmother   . Heart disease Paternal Grandfather     MI  . Migraines Daughter   . Migraines Son   . Migraines Son   . Migraines Daughter    Social History  Substance Use Topics  . Smoking status: Never Smoker  . Smokeless tobacco: Never Used  . Alcohol use No   OB History    No data available     Review of Systems  Constitutional: Negative.   HENT: Negative.   Eyes: Negative.   Respiratory: Negative.   Cardiovascular: Negative.   Gastrointestinal: Negative.   Endocrine: Negative.   Genitourinary: Negative.   Musculoskeletal: Positive for arthralgias, back pain, joint swelling and myalgias.  Skin: Negative.   Allergic/Immunologic: Negative.   Neurological: Negative.   Hematological: Negative.   Psychiatric/Behavioral: Negative.  Allergies  Sulfa antibiotics and Penicillins  Home Medications   Prior to Admission medications   Medication Sig Start Date End Date Taking? Authorizing Provider  amitriptyline (ELAVIL) 100 MG tablet Take 1 tablet (100 mg total) by mouth at bedtime. 09/24/15  Yes Adam Mliss Fritz, DO  atorvastatin (LIPITOR) 40 MG tablet Take 1 tablet (40 mg total) by mouth daily. 01/29/15 01/29/16 Yes Gabriel Cirri, NP  calcium carbonate (TUMS EX) 750 MG chewable tablet Chew 1 tablet by mouth daily.   Yes Historical Provider, MD  Cholecalciferol (VITAMIN D-3 PO) Take 2,000 Units by mouth daily.   Yes Historical Provider, MD   Cyanocobalamin (VITAMIN B-12) 500 MCG LOZG Take by mouth daily.   Yes Historical Provider, MD  dexlansoprazole (DEXILANT) 60 MG capsule Take 60 mg by mouth daily.   Yes Historical Provider, MD  estradiol (ESTRACE) 1 MG tablet Take 1 tablet (1 mg total) by mouth daily. 02/13/15  Yes Gabriel Cirri, NP  GARCINIA CAMBOGIA-CHROMIUM PO Take 1,600 mg by mouth daily.   Yes Historical Provider, MD  hydrochlorothiazide (HYDRODIURIL) 25 MG tablet Take 1 tablet (25 mg total) by mouth daily. 06/24/15  Yes Gabriel Cirri, NP  levothyroxine (SYNTHROID, LEVOTHROID) 112 MCG tablet TAKE 1 TABLET BY MOUTH EVERY DAY ON AN EMPTY STOMACH 09/18/15  Yes Gabriel Cirri, NP  Magnesium 250 MG TABS Take by mouth daily.   Yes Historical Provider, MD  nitrofurantoin, macrocrystal-monohydrate, (MACROBID) 100 MG capsule Take 1 capsule (100 mg total) by mouth 2 (two) times daily. 06/24/15  Yes Gabriel Cirri, NP  ondansetron (ZOFRAN) 8 MG tablet Take 8 mg by mouth every 8 (eight) hours as needed for nausea or vomiting.   Yes Historical Provider, MD  oxyCODONE (OXYCONTIN) 20 mg 12 hr tablet Take 20 mg by mouth every 12 (twelve) hours.   Yes Historical Provider, MD  Oxycodone HCl 10 MG TABS Take 10 mg by mouth daily.   Yes Historical Provider, MD  phenazopyridine (PYRIDIUM) 200 MG tablet Take 1 tablet (200 mg total) by mouth 3 (three) times daily as needed for pain. 02/23/15  Yes Sharman Cheek, MD  potassium chloride SA (K-DUR,KLOR-CON) 20 MEQ tablet Take 1 tablet (20 mEq total) by mouth daily. 01/29/15 01/29/16 Yes Gabriel Cirri, NP  pregabalin (LYRICA) 100 MG capsule Take 100 mg by mouth 3 (three) times daily.   Yes Historical Provider, MD  promethazine (PHENERGAN) 25 MG tablet Take 1 tablet (25 mg total) by mouth every 8 (eight) hours as needed for nausea or vomiting. 05/04/15  Yes Gabriel Cirri, NP  SUMAtriptan (IMITREX) 100 MG tablet Take 1 tablet.  May repeat once in 2 hours if headache persists or recurs. 09/24/15  Yes Drema Dallas,  DO  tamsulosin (FLOMAX) 0.4 MG CAPS capsule Take 1 capsule (0.4 mg total) by mouth daily. 02/23/15  Yes Sharman Cheek, MD  triamterene-hydrochlorothiazide (DYAZIDE) 37.5-25 MG capsule Take 1 each (1 capsule total) by mouth daily. 07/29/15  Yes Gabriel Cirri, NP  Turmeric Curcumin 500 MG CAPS Take 500 mg by mouth daily.   Yes Historical Provider, MD  doxycycline (VIBRAMYCIN) 100 MG capsule Take 1 capsule (100 mg total) by mouth 2 (two) times daily. Patient not taking: Reported on 09/25/2015 09/23/15   Jolene Provost, MD  Multiple Vitamins-Minerals (MULTI + OMEGA-3 ADULT GUMMIES PO) Take by mouth daily.    Historical Provider, MD   Meds Ordered and Administered this Visit  Medications - No data to display  BP (!) 146/100 (BP Location: Right Arm)  Pulse 80   Temp 98.4 F (36.9 C) (Oral)   Resp 16   Ht 5\' 2"  (1.575 m)   Wt 132 lb (59.9 kg)   LMP  (LMP Unknown)   SpO2 98%   BMI 24.14 kg/m  No data found.  Physical Exam  Left Upper Extremity: Examination of the left shoulder and arm showed no bony abnormality or edema.  Patient has pain with palpation of the dorsal aspect of the distal radius.  The patient has moderate pain with shoulder motion. Pt is able to reach to 90 degrees forward flexion and abduction. The patient has a negative drop arm test. The patient has a negative yergasons and speeds test.  The patient is tender along the deltoid muscle.  There is no subacromial space tenderness with no AC joint tenderness.  The patient has no instability of the shoulder with anterior-posterior motion.  There is a negative sulcus sign.  The rotator cuff muscle strength is 3/5 with supraspinatus, 5/5 with internal rotation, and 5/5 with external rotation.  Pt is able to supinate and pronate the left wrist with mild pain, flexion and extension was not evaluated.  Cap refill intact to each digit.   Neurological: The patient has sensation that is intact to light touch and pinprick bilaterally.  The  patient has normal grip strength.  The patient has full biceps, wrist extension, grip, and interosseous strength.  The patient has 2 + DTRs bilaterally.  Vascular: The patient has less than 2 second capillary refill.  The patient has normal ulnar and radial pulses.  The patient has normal warmth to touch.    Urgent Care Course   Clinical Course    Procedures (including critical care time)  Labs Review Labs Reviewed - No data to display  Imaging Review Dg Elbow Complete Left  Result Date: 09/26/2015 CLINICAL DATA:  Status post fall last night with left elbow pain. EXAM: LEFT ELBOW - COMPLETE 3+ VIEW COMPARISON:  None. FINDINGS: There is no evidence of fracture, dislocation, or joint effusion. Degenerative joint changes of the knee of left elbow are noted. Soft tissues are unremarkable. IMPRESSION: No acute fracture or dislocation. Electronically Signed   By: Sherian Rein M.D.   On: 09/26/2015 11:31   Dg Wrist Complete Left  Result Date: 09/26/2015 CLINICAL DATA:  Status post fall last night with pain of the left wrist EXAM: LEFT WRIST - COMPLETE 3+ VIEW COMPARISON:  None. FINDINGS: There is no evidence of fracture or dislocation. Soft tissues are unremarkable. IMPRESSION: Negative. Electronically Signed   By: Sherian Rein M.D.   On: 09/26/2015 11:28   Dg Shoulder Left  Result Date: 09/26/2015 CLINICAL DATA:  Status post fall last night with pain of left shoulder. EXAM: LEFT SHOULDER - 2+ VIEW COMPARISON:  None. FINDINGS: There is no evidence of fracture or dislocation. Mild degenerative joint changes of proximal left humerus is noted. Soft tissues are unremarkable. IMPRESSION: No acute fracture or dislocation. Electronically Signed   By: Sherian Rein M.D.   On: 09/26/2015 11:29    MDM   1. Left wrist sprain, initial encounter   2. Left shoulder pain   -  Treatment options discussed with the patient. -  Wrist splint provided for left wrist, continue with current pain prescriptions  and add tylenol as needed. -  Follow-up as needed if symptoms persist.    Anson Oregon, PA-C 09/26/15 1151

## 2015-09-29 ENCOUNTER — Encounter: Payer: BLUE CROSS/BLUE SHIELD | Admitting: Neurology

## 2015-10-01 ENCOUNTER — Encounter: Payer: BLUE CROSS/BLUE SHIELD | Admitting: Neurology

## 2015-10-06 ENCOUNTER — Encounter: Payer: BLUE CROSS/BLUE SHIELD | Admitting: Psychology

## 2015-10-06 DIAGNOSIS — Z029 Encounter for administrative examinations, unspecified: Secondary | ICD-10-CM

## 2015-10-16 NOTE — Telephone Encounter (Signed)
error 

## 2015-10-20 ENCOUNTER — Other Ambulatory Visit: Payer: Self-pay | Admitting: Unknown Physician Specialty

## 2015-10-20 NOTE — Telephone Encounter (Signed)
Your patient 

## 2015-10-22 ENCOUNTER — Ambulatory Visit (INDEPENDENT_AMBULATORY_CARE_PROVIDER_SITE_OTHER): Payer: BLUE CROSS/BLUE SHIELD | Admitting: Neurology

## 2015-10-22 DIAGNOSIS — R202 Paresthesia of skin: Secondary | ICD-10-CM | POA: Diagnosis not present

## 2015-10-22 NOTE — Procedures (Signed)
[facility]  [address], [address]  [place], [place] [address] Tel: [phone] Fax:  [phone] Test Date:  10/22/2015  Patient: Alyssa FishmanLinda Zuniga DOB: 31-Jul-1965 Physician: Nita Sickleonika Patel, DO  Sex: Female Height: 5\' 2"  Ref Phys: Shon MilletJaffe, Adam  ID#: [id number] Temp: 34.2C Technician: Judie PetitM. Dean   Patient Complaints: This is a 50 year old female referred for evaluation of generalized numbness, tingling, pain, and weakness of all her extremities.  NCV & EMG Findings: Extensive electrodiagnostic testing of the right upper and lower extremity shows: 1. All sensory responses including the right median, ulnar, mixed palmer, sural, and superficial peroneal nerves are within normal limits. 2. All motor responses including the right median, ulnar, peroneal, and tibial nerves are within normal limits. 3. Bilateral tibial H reflex studies are normal. 4. There is no evidence of active or chronic motor axon loss changes affecting any of the tested muscles.  Motor unit configuration and recruitment pattern is within normal limits.  Impression: This is a normal study of the right upper and lower extremities. In particular, there is no evidence of a generalized sensorimotor polyneuropathy or cervical/lumbosacral radiculopathy.   ___________________________ Nita Sickleonika Patel, DO    Nerve Conduction Studies Anti Sensory Summary Table   Site NR Peak (ms) Norm Peak (ms) P-T Amp (V) Norm P-T Amp  Right Median Anti Sensory (2nd Digit)  34.2C  Wrist    2.9 <3.4 26.7 >20  Right Sup Peroneal Anti Sensory (Ant Lat Mall)  34.2C  12 cm    2.8 <4.5 9.8 >5  Right Sural Anti Sensory (Lat Mall)  34.2C  Calf    3.0 <4.5 15.0 >5  Right Ulnar Anti Sensory (5th Digit)  34.2C  Wrist    2.6 <3.1 24.5 >12   Motor Summary Table   Site NR Onset (ms) Norm Onset (ms) O-P Amp (mV) Norm O-P Amp Site1 Site2 Delta-0 (ms) Dist (cm) Vel (m/s) Norm Vel (m/s)  Right Median Motor (Abd Poll Brev)  34.2C  Wrist     3.4 <3.9 8.9 >6 Elbow Wrist 3.5 20.0 57 >50  Elbow    6.9  8.8         Right Peroneal Motor (Ext Dig Brev)  34.2C  Ankle    3.6 <5.5 4.7 >3 B Fib Ankle 5.2 29.0 56 >40  B Fib    8.8  4.7  Poplt B Fib 1.8 10.0 56 >40  Poplt    10.6  4.6         Right Tibial Motor (Abd Hall Brev)  34.2C  Ankle    3.0 <6.0 12.8 >8 Knee Ankle 7.6 36.0 47 >40  Knee    10.6  10.5         Right Ulnar Motor (Abd Dig Minimi)  34.2C  Wrist    2.3 <3.1 7.5 >7 B Elbow Wrist 2.7 15.0 56 >50  B Elbow    5.0  7.5  A Elbow B Elbow 1.7 10.0 59 >50  A Elbow    6.7  7.1          Comparison Summary Table   Site NR Peak (ms) Norm Peak (ms) P-T Amp (V) Site1 Site2 Delta-P (ms) Norm Delta (ms)  Right Median/Ulnar Palm Comparison (Wrist - 8cm)  34.2C  Median Palm    2.1 <2.2 18.9 Median Palm Ulnar Palm 0.0   Ulnar Palm    2.1 <2.2 8.0       H Reflex Studies   NR H-Lat (ms) Lat Norm (  ms) L-R H-Lat (ms) M-Lat (ms) HLat-MLat (ms)  Left Tibial (Gastroc)  34.2C     30.48 <35 0.27 3.95 26.53  Right Tibial (Gastroc)  34.2C     30.75 <35 0.27 3.95 26.80   EMG   Side Muscle Ins Act Fibs Psw Fasc Number Recrt Dur Dur. Amp Amp. Poly Poly. Comment  Right AntTibialis Nml Nml Nml Nml Nml Nml Nml Nml Nml Nml Nml Nml N/A  Right Lumbo Parasp Low Nml Nml Nml Nml Nml Nml Nml Nml Nml Nml Nml Nml N/A  Right Gastroc Nml Nml Nml Nml Nml Nml Nml Nml Nml Nml Nml Nml N/A  Right Flex Dig Long Nml Nml Nml Nml Nml Nml Nml Nml Nml Nml Nml Nml N/A  Right RectFemoris Nml Nml Nml Nml Nml Nml Nml Nml Nml Nml Nml Nml N/A  Right GluteusMed Nml Nml Nml Nml Nml Nml Nml Nml Nml Nml Nml Nml N/A  Right BicepsFemS Nml Nml Nml Nml Nml Nml Nml Nml Nml Nml Nml Nml N/A  Right 1stDorInt Nml Nml Nml Nml Nml Nml Nml Nml Nml Nml Nml Nml N/A  Right Ext Indicis Nml Nml Nml Nml Nml Nml Nml Nml Nml Nml Nml Nml N/A  Right PronatorTeres Nml Nml Nml Nml Nml Nml Nml Nml Nml Nml Nml Nml N/A  Right Biceps Nml Nml Nml Nml Nml Nml Nml Nml Nml Nml Nml Nml N/A  Right  Triceps Nml Nml Nml Nml Nml Nml Nml Nml Nml Nml Nml Nml N/A  Right Deltoid Nml Nml Nml Nml Nml Nml Nml Nml Nml Nml Nml Nml N/A      Waveforms:

## 2015-10-23 ENCOUNTER — Telehealth: Payer: Self-pay

## 2015-10-23 NOTE — Telephone Encounter (Signed)
-----   Message from Drema DallasAdam R Jaffe, DO sent at 10/23/2015  7:08 AM EDT ----- Nerve study is normal

## 2015-10-23 NOTE — Telephone Encounter (Signed)
Message relayed to patient. Verbalized understanding and denied questions.   

## 2015-10-26 ENCOUNTER — Ambulatory Visit: Payer: BLUE CROSS/BLUE SHIELD | Admitting: Unknown Physician Specialty

## 2015-11-03 ENCOUNTER — Encounter: Payer: Self-pay | Admitting: Unknown Physician Specialty

## 2015-11-03 ENCOUNTER — Ambulatory Visit (INDEPENDENT_AMBULATORY_CARE_PROVIDER_SITE_OTHER): Payer: BLUE CROSS/BLUE SHIELD | Admitting: Unknown Physician Specialty

## 2015-11-03 VITALS — BP 125/90 | HR 99 | Temp 98.1°F | Ht 61.6 in | Wt 130.4 lb

## 2015-11-03 DIAGNOSIS — R5382 Chronic fatigue, unspecified: Secondary | ICD-10-CM

## 2015-11-03 DIAGNOSIS — G43109 Migraine with aura, not intractable, without status migrainosus: Secondary | ICD-10-CM

## 2015-11-03 DIAGNOSIS — Z0000 Encounter for general adult medical examination without abnormal findings: Secondary | ICD-10-CM

## 2015-11-03 DIAGNOSIS — E039 Hypothyroidism, unspecified: Secondary | ICD-10-CM | POA: Diagnosis not present

## 2015-11-03 DIAGNOSIS — I10 Essential (primary) hypertension: Secondary | ICD-10-CM

## 2015-11-03 DIAGNOSIS — E7800 Pure hypercholesterolemia, unspecified: Secondary | ICD-10-CM

## 2015-11-03 DIAGNOSIS — M797 Fibromyalgia: Secondary | ICD-10-CM

## 2015-11-03 DIAGNOSIS — R61 Generalized hyperhidrosis: Secondary | ICD-10-CM

## 2015-11-03 MED ORDER — TOPIRAMATE 50 MG PO TABS: 50.0000 mg | ORAL_TABLET | Freq: Two times a day (BID) | ORAL | 1 refills | Status: DC

## 2015-11-03 NOTE — Assessment & Plan Note (Signed)
Stable, continue present medications.   

## 2015-11-03 NOTE — Progress Notes (Signed)
BP 125/90 (BP Location: Left Arm, Patient Position: Sitting, Cuff Size: Normal)   Pulse 99   Temp 98.1 F (36.7 C)   Ht 5' 1.6" (1.565 m)   Wt 130 lb 6.4 oz (59.1 kg)   LMP  (LMP Unknown)   SpO2 98%   BMI 24.16 kg/m    Subjective:    Patient ID: Alyssa Zuniga, female    DOB: 1966/01/01, 50 y.o.   MRN: [id number]  HPI: Alyssa Zuniga is a 50 y.o. female  Chief Complaint  Patient presents with  . Hypertension  . Migraine    pt states she just recently started talking topimirate again    Hypertension Using medications without difficulty Average home BPs Not checking   No problems or lightheadedness No chest pain with exertion or shortness of breath No Edema  Insomnia Improving but waking with night sweats  Knee pain Left knee locking.  Wears a knee brace.    Migraine Improved and restarted topiramate.  She would like a refill and planning to f/u with neurologist.  Hypothyroid Extremely tired.  Last TSH was last year  Relevant past medical, surgical, family and social history reviewed and updated as indicated. Interim medical history since our last visit reviewed. Allergies and medications reviewed and updated.  Review of Systems  Per HPI unless specifically indicated above     Objective:    BP 125/90 (BP Location: Left Arm, Patient Position: Sitting, Cuff Size: Normal)   Pulse 99   Temp 98.1 F (36.7 C)   Ht 5' 1.6" (1.565 m)   Wt 130 lb 6.4 oz (59.1 kg)   LMP  (LMP Unknown)   SpO2 98%   BMI 24.16 kg/m   Wt Readings from Last 3 Encounters:  11/03/15 130 lb 6.4 oz (59.1 kg)  09/26/15 132 lb (59.9 kg)  09/25/15 132 lb 3.2 oz (60 kg)    Physical Exam  Constitutional: She is oriented to person, place, and time. She appears well-developed and well-nourished. No distress.  HENT:  Head: Normocephalic and atraumatic.  Eyes: Conjunctivae and lids are normal. Right eye exhibits no discharge. Left eye exhibits no discharge. No scleral icterus.  Neck: Normal  range of motion. Neck supple. No JVD present. Carotid bruit is not present.  Cardiovascular: Normal rate, regular rhythm and normal heart sounds.   Pulmonary/Chest: Effort normal and breath sounds normal.  Abdominal: Normal appearance. There is no splenomegaly or hepatomegaly.  Musculoskeletal: Normal range of motion.  Neurological: She is alert and oriented to person, place, and time.  Skin: Skin is warm, dry and intact. No rash noted. No pallor.  Psychiatric: She has a normal mood and affect. Her behavior is normal. Judgment and thought content normal.    Results for orders placed or performed during the hospital encounter of 08/12/15  I-STAT creatinine  Result Value Ref Range   Creatinine, Ser 1.00 0.44 - 1.00 mg/dL      Assessment & Plan:   Problem List Items Addressed This Visit      Unprioritized   Diaphoresis - Primary    Probably due to anxiety and Topirimate      Fibromyalgia    No real change  Having a lot of fatigue      Hypertension    Stable, continue present medications.       Hypothyroidism   Relevant Orders   Thyroid Panel With TSH   Migraines   Relevant Medications   citalopram (CELEXA) 20 MG tablet  topiramate (TOPAMAX) 50 MG tablet    Other Visit Diagnoses    Routine general medical examination at a health care facility       Relevant Orders   HIV antibody   Chronic fatigue       Relevant Orders   Thyroid Panel With TSH       Follow up plan: Return in about 3 months (around 02/03/2016).

## 2015-11-03 NOTE — Assessment & Plan Note (Signed)
Probably due to anxiety and Topirimate

## 2015-11-03 NOTE — Assessment & Plan Note (Signed)
No real change  Having a lot of fatigue

## 2015-11-04 LAB — LIPID PANEL W/O CHOL/HDL RATIO
CHOLESTEROL TOTAL: 288 mg/dL — AB (ref 100–199)
HDL: 44 mg/dL (ref >39)
LDL Calculated: 203 mg/dL — ABNORMAL HIGH (ref 0–99)
TRIGLYCERIDES: 204 mg/dL — AB (ref 0–149)
VLDL CHOLESTEROL CAL: 41 mg/dL — AB (ref 5–40)

## 2015-11-04 LAB — HIV ANTIBODY (ROUTINE TESTING W REFLEX): HIV Screen 4th Generation wRfx: NONREACTIVE

## 2015-11-04 LAB — THYROID PANEL WITH TSH
Free Thyroxine Index: 2 (ref 1.2–4.9)
T3 Uptake Ratio: 23 % — ABNORMAL LOW (ref 24–39)
T4, Total: 8.5 ug/dL (ref 4.5–12.0)
TSH: 7.4 u[IU]/mL — ABNORMAL HIGH (ref 0.450–4.500)

## 2015-11-05 ENCOUNTER — Encounter: Payer: BLUE CROSS/BLUE SHIELD | Admitting: Psychology

## 2015-11-05 DIAGNOSIS — Z029 Encounter for administrative examinations, unspecified: Secondary | ICD-10-CM

## 2015-11-22 ENCOUNTER — Other Ambulatory Visit: Payer: Self-pay | Admitting: Unknown Physician Specialty

## 2015-11-25 ENCOUNTER — Other Ambulatory Visit: Payer: Self-pay

## 2015-11-25 MED ORDER — CITALOPRAM HYDROBROMIDE 20 MG PO TABS: 20.0000 mg | ORAL_TABLET | Freq: Every day | ORAL | 3 refills | Status: DC

## 2015-12-23 ENCOUNTER — Other Ambulatory Visit: Payer: Self-pay | Admitting: Neurology

## 2015-12-23 NOTE — Telephone Encounter (Signed)
Refill sent pt to continue medication per last OV note.

## 2016-01-01 ENCOUNTER — Encounter: Payer: Self-pay | Admitting: Emergency Medicine

## 2016-01-01 ENCOUNTER — Ambulatory Visit
Admission: EM | Admit: 2016-01-01 | Discharge: 2016-01-01 | Disposition: A | Discharge status: Home / Self Care | Payer: BLUE CROSS/BLUE SHIELD | Attending: Family Medicine | Admitting: Family Medicine

## 2016-01-01 DIAGNOSIS — L03031 Cellulitis of right toe: Secondary | ICD-10-CM | POA: Diagnosis not present

## 2016-01-01 DIAGNOSIS — L600 Ingrowing nail: Secondary | ICD-10-CM

## 2016-01-01 DIAGNOSIS — S8391XA Sprain of unspecified site of right knee, initial encounter: Secondary | ICD-10-CM

## 2016-01-01 DIAGNOSIS — L02611 Cutaneous abscess of right foot: Secondary | ICD-10-CM | POA: Diagnosis not present

## 2016-01-01 MED ORDER — CEPHALEXIN 500 MG PO CAPS: 500.0000 mg | ORAL_CAPSULE | Freq: Three times a day (TID) | ORAL | 0 refills | Status: DC

## 2016-01-01 NOTE — ED Provider Notes (Signed)
[facility]    CSN: [id number] Arrival date & time: 01/01/16  1907     History   Chief Complaint Chief Complaint  Patient presents with  . Toe Pain    right 1st toe    HPI Alyssa Zuniga is a 50 y.o. female.   50 yo female with a c/o right big toe pain, redness, drainage and ingrown toenail for 2 weeks. Denies any injuries, fevers, chills.  Also c/o right knee pain since she twisted it yesterday as she was getting out of the truck. Denies any fall or direct trauma.   The history is provided by the patient.  Toe Pain     Past Medical History:  Diagnosis Date  . Anxiety   . 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 Chronic pain    lupus, fibromyalgia  . Depression   . Fibromyalgia   . GERD (gastroesophageal reflux disease)   . Headache, common migraine   . Hypertension   . Hypothyroidism   . IBS (irritable bowel syndrome)   . Kidney stones   . Manic depression (HCC)   . Migraines    2-3x/wk  . Occasional tremors     Patient Active Problem List   Diagnosis Date Noted  . Dyspareunia in female 09/25/2015  . Headache disorder 06/24/2015  . Noninfectious diarrhea   . Heartburn   . Diarrhea   . Gastritis   . Arthritis, degenerative 05/28/2015  . Adaptive colitis 05/28/2015  . Hypertension 04/21/2015  . Depression 04/21/2015  . Tremors of nervous system 01/14/2015  . Incontinence of bowel 01/14/2015  . Incontinence of urine 01/14/2015  . Fibromyalgia 12/15/2014  . Chronic pain 12/15/2014  . Vitamin D deficiency 12/15/2014  . Frozen shoulder 12/15/2014  . Hyperemesis 12/15/2014  . Hypothyroidism 11/05/2014  . Diaphoresis 11/05/2014  . Memory loss 11/05/2014  . Hypercholesteremia 11/05/2014  . Migraines 11/05/2014  . Insomnia 11/05/2014  . Atypical chest pain 07/07/2014  . Peripheral nerve disease (HCC) 03/06/2014  . Cephalalgia 10/02/2013  . Anxiety 05/07/2013  . Combined fat and carbohydrate induced hyperlipemia  04/06/2013  . Carpal tunnel syndrome 02/03/2013  . Neuropathy (HCC) 10/29/2012  . Acid reflux 10/29/2012  . Back ache 08/10/2012  . Benign hypertension 09/25/2008    Past Surgical History:  Procedure Laterality Date  . ABDOMINAL HYSTERECTOMY    . bunion removal Right   . CARDIAC CATHETERIZATION  07/05/14   [facility]  . CESAREAN SECTION    . COLONOSCOPY WITH PROPOFOL N/A 06/15/2015   Procedure: COLONOSCOPY WITH PROPOFOL;  Surgeon: Midge Miniumarren Wohl, MD;  [facility];  Service: Endoscopy;  Laterality: N/A;  . ESOPHAGOGASTRODUODENOSCOPY (EGD) WITH PROPOFOL N/A 06/15/2015   Procedure: ESOPHAGOGASTRODUODENOSCOPY (EGD) WITH PROPOFOL;  Surgeon: Midge Miniumarren Wohl, MD;  [facility];  Service: Endoscopy;  Laterality: N/A;    OB History    No data available       Home Medications    Prior to Admission medications   Medication Sig Start Date End Date Taking? Authorizing Provider  amitriptyline (ELAVIL) 100 MG tablet TAKE 1 TABLET(100 MG) BY MOUTH AT BEDTIME 12/23/15   Drema DallasAdam R Jaffe, DO  atorvastatin (LIPITOR) 40 MG tablet Take 1 tablet (40 mg total) by mouth daily. Patient not taking: Reported on 11/03/2015 01/29/15 01/29/16  Gabriel Cirriheryl Wicker, NP  calcium carbonate (TUMS EX) 750 MG chewable tablet Chew 1 tablet by mouth daily.    Historical Provider, MD  cephALEXin [facility](KEFLEX)  500 MG capsule Take 1 capsule (500 mg total) by mouth 3 (three) times daily. 01/01/16   Payton Mccallum, MD  Cholecalciferol (VITAMIN D-3 PO) Take 2,000 Units by mouth daily.    Historical Provider, MD  citalopram (CELEXA) 20 MG tablet Take 1 tablet (20 mg total) by mouth daily. 11/25/15   Gabriel Cirri, NP  Cyanocobalamin (VITAMIN B-12) 500 MCG LOZG Take by mouth as needed.     Historical Provider, MD  dexlansoprazole (DEXILANT) 60 MG capsule Take 60 mg by mouth daily.    Historical Provider, MD  estradiol (ESTRACE) 1 MG tablet Take 1 tablet (1 mg total) by mouth daily. 02/13/15   Gabriel Cirri, NP  GARCINIA  CAMBOGIA-CHROMIUM PO Take 1,600 mg by mouth daily.    Historical Provider, MD  hydrochlorothiazide (HYDRODIURIL) 25 MG tablet Take 1 tablet (25 mg total) by mouth daily. Patient not taking: Reported on 11/03/2015 06/24/15   Gabriel Cirri, NP  levothyroxine (SYNTHROID, LEVOTHROID) 112 MCG tablet TAKE 1 TABLET BY MOUTH EVERY DAY ON AN EMPTY STOMACH 11/23/15   Gabriel Cirri, NP  Magnesium 250 MG TABS Take by mouth daily.    Historical Provider, MD  Multiple Vitamins-Minerals (MULTI + OMEGA-3 ADULT GUMMIES PO) Take by mouth daily.    Historical Provider, MD  ondansetron (ZOFRAN) 8 MG tablet Take 8 mg by mouth every 8 (eight) hours as needed for nausea or vomiting.    Historical Provider, MD  oxyCODONE (OXYCONTIN) 20 mg 12 hr tablet Take 20 mg by mouth every 12 (twelve) hours.    Historical Provider, MD  Oxycodone HCl 10 MG TABS Take 10 mg by mouth daily.    Historical Provider, MD  phenazopyridine (PYRIDIUM) 200 MG tablet Take 1 tablet (200 mg total) by mouth 3 (three) times daily as needed for pain. 02/23/15   Sharman Cheek, MD  potassium chloride SA (K-DUR,KLOR-CON) 20 MEQ tablet Take 1 tablet (20 mEq total) by mouth daily. Patient not taking: Reported on 11/03/2015 01/29/15 01/29/16  Gabriel Cirri, NP  pregabalin (LYRICA) 100 MG capsule Take 100 mg by mouth 3 (three) times daily.    Historical Provider, MD  promethazine (PHENERGAN) 25 MG tablet Take 1 tablet (25 mg total) by mouth every 8 (eight) hours as needed for nausea or vomiting. 05/04/15   Gabriel Cirri, NP  SUMAtriptan (IMITREX) 100 MG tablet Take 1 tablet.  May repeat once in 2 hours if headache persists or recurs. 09/24/15   Drema Dallas, DO  tamsulosin (FLOMAX) 0.4 MG CAPS capsule Take 1 capsule (0.4 mg total) by mouth daily. Patient not taking: Reported on 11/03/2015 02/23/15   Sharman Cheek, MD  topiramate (TOPAMAX) 50 MG tablet Take 1 tablet (50 mg total) by mouth 2 (two) times daily. 11/03/15   Gabriel Cirri, NP    triamterene-hydrochlorothiazide (DYAZIDE) 37.5-25 MG capsule Take 1 each (1 capsule total) by mouth daily. 07/29/15   Gabriel Cirri, NP  Turmeric Curcumin 500 MG CAPS Take 500 mg by mouth daily.    Historical Provider, MD    Family History Family History  Problem Relation Age of Onset  . Depression Mother   . Anxiety disorder Mother   . Cancer Mother     breast  . Migraines Mother   . Stroke Father   . Heart disease Father     MI  . Alcohol abuse Father   . Arthritis Father   . Migraines Father   . Thyroid disease Father   . Thyroid disease Maternal Grandmother   .  Hyperlipidemia Maternal Grandmother   . Migraines Brother   . Anxiety disorder Brother   . Heart disease Maternal Grandfather     MI  . Hyperlipidemia Paternal Grandmother   . Thyroid disease Paternal Grandmother   . Heart disease Paternal Grandfather     MI  . Migraines Daughter   . Migraines Son   . Migraines Son   . Migraines Daughter     Social History Social History  Substance Use Topics  . Smoking status: Never Smoker  . Smokeless tobacco: Never Used  . Alcohol use No     Allergies   Sulfa antibiotics and Penicillins   Review of Systems Review of Systems   Physical Exam Triage Vital Signs ED Triage Vitals  Enc Vitals Group     BP 01/01/16 1945 (!) 151/97     Pulse Rate 01/01/16 1945 77     Resp 01/01/16 1945 16     Temp 01/01/16 1945 98.5 F (36.9 C)     Temp Source 01/01/16 1945 Oral     SpO2 01/01/16 1945 97 %     Weight 01/01/16 1945 135 lb (61.2 kg)     Height 01/01/16 1945 5\' 2"  (1.575 m)     Head Circumference --      Peak Flow --      Pain Score 01/01/16 1947 6     Pain Loc --      Pain Edu? --      Excl. in GC? --    No data found.   Updated Vital Signs BP (!) 151/97 (BP Location: Right Arm)   Pulse 77   Temp 98.5 F (36.9 C) (Oral)   Resp 16   Ht 5\' 2"  (1.575 m)   Wt 135 lb (61.2 kg)   LMP  (LMP Unknown)   SpO2 97%   BMI 24.69 kg/m   Visual  Acuity Right Eye Distance:   Left Eye Distance:   Bilateral Distance:    Right Eye Near:   Left Eye Near:    Bilateral Near:     Physical Exam  Constitutional: She appears well-developed and well-nourished. No distress.  Musculoskeletal:       Right knee: She exhibits normal range of motion, no swelling, no effusion, no ecchymosis, no deformity, no laceration, no erythema, normal alignment, no LCL laxity, normal patellar mobility, no bony tenderness, normal meniscus and no MCL laxity. Tenderness (diffuse) found.  Skin: She is not diaphoretic. There is erythema.  Right big toe with medial edge ingrown toenail, tenderness to palpation, mild drainage and surrounding blanchable erythema to surrounding skin  Nursing note and vitals reviewed.    UC Treatments / Results  Labs (all labs ordered are listed, but only abnormal results are displayed) Labs Reviewed - No data to display  EKG  EKG Interpretation None       Radiology No results found.  Procedures Procedures (including critical care time)  Medications Ordered in UC Medications - No data to display   Initial Impression / Assessment and Plan / UC Course  I have reviewed the triage vital signs and the nursing notes.  Pertinent labs & imaging results that were available during my care of the patient were reviewed by me and considered in my medical decision making (see chart for details).  Clinical Course       Final Clinical Impressions(s) / UC Diagnoses   Final diagnoses:  Cellulitis and abscess of toe of right foot  Ingrown right big toenail  Sprain of right knee, unspecified ligament, initial encounter    New Prescriptions Discharge Medication List as of 01/01/2016  8:17 PM    START taking these medications   Details  cephALEXin (KEFLEX) 500 MG capsule Take 1 capsule (500 mg total) by mouth 3 (three) times daily., Starting Fri 01/01/2016, Normal       1. diagnosis reviewed with patient 2. rx as per  orders above; reviewed possible side effects, interactions, risks and benefits  3. Recommend supportive treatment with warm compresses to toe; ice to knee; elevation 4. Follow-up prn if symptoms worsen or don't improve   Payton Mccallumrlando Tramon Crescenzo, MD 01/01/16 2037

## 2016-01-01 NOTE — ED Triage Notes (Signed)
Patient c/o right knee pain since yesterday.  Patient c/o pain in her right 1st toe for the past 2 weeks.  Patient states that she twisted her right knee while getting out of her truck.

## 2016-01-04 ENCOUNTER — Telehealth: Payer: Self-pay | Admitting: *Deleted

## 2016-01-04 NOTE — Telephone Encounter (Signed)
Courtesy call back, no answer, left message to follow up with PCP or return to [facility] if symptoms persist.

## 2016-01-07 ENCOUNTER — Ambulatory Visit: Payer: BLUE CROSS/BLUE SHIELD | Admitting: Neurology

## 2016-01-11 ENCOUNTER — Other Ambulatory Visit: Payer: Self-pay | Admitting: Unknown Physician Specialty

## 2016-01-11 NOTE — Telephone Encounter (Signed)
Your patient 

## 2016-01-12 ENCOUNTER — Other Ambulatory Visit: Payer: Self-pay | Admitting: Neurology

## 2016-01-12 ENCOUNTER — Encounter: Payer: Self-pay | Admitting: Neurology

## 2016-01-12 ENCOUNTER — Telehealth: Payer: Self-pay | Admitting: Neurology

## 2016-01-12 NOTE — Telephone Encounter (Signed)
Patient dismissed from [facility] by Shon MilletAdam Jaffe DO , effective January 12, 2016. Dismissal letter sent out by certified / registered mail. DAJ

## 2016-01-19 NOTE — Telephone Encounter (Signed)
Received signed domestic return receipt verifying delivery of certified letter on January 15, 2016. Article number [id number] [id number] DAJ

## 2016-02-03 ENCOUNTER — Ambulatory Visit: Payer: BLUE CROSS/BLUE SHIELD | Admitting: Unknown Physician Specialty

## 2016-02-15 ENCOUNTER — Other Ambulatory Visit: Payer: Self-pay | Admitting: Unknown Physician Specialty

## 2016-03-04 DIAGNOSIS — I10 Essential (primary) hypertension: Secondary | ICD-10-CM | POA: Diagnosis not present

## 2016-03-04 DIAGNOSIS — R0781 Pleurodynia: Secondary | ICD-10-CM | POA: Diagnosis not present

## 2016-03-04 DIAGNOSIS — S2242XA Multiple fractures of ribs, left side, initial encounter for closed fracture: Secondary | ICD-10-CM | POA: Diagnosis not present

## 2016-03-04 DIAGNOSIS — Z880 Allergy status to penicillin: Secondary | ICD-10-CM | POA: Diagnosis not present

## 2016-03-04 DIAGNOSIS — M797 Fibromyalgia: Secondary | ICD-10-CM | POA: Diagnosis not present

## 2016-03-04 DIAGNOSIS — E039 Hypothyroidism, unspecified: Secondary | ICD-10-CM | POA: Diagnosis not present

## 2016-03-04 DIAGNOSIS — Z79890 Hormone replacement therapy (postmenopausal): Secondary | ICD-10-CM | POA: Diagnosis not present

## 2016-03-04 DIAGNOSIS — Z882 Allergy status to sulfonamides status: Secondary | ICD-10-CM | POA: Diagnosis not present

## 2016-03-04 DIAGNOSIS — F419 Anxiety disorder, unspecified: Secondary | ICD-10-CM | POA: Diagnosis not present

## 2016-03-04 DIAGNOSIS — Z79899 Other long term (current) drug therapy: Secondary | ICD-10-CM | POA: Diagnosis not present

## 2016-03-04 DIAGNOSIS — F329 Major depressive disorder, single episode, unspecified: Secondary | ICD-10-CM | POA: Diagnosis not present

## 2016-03-07 ENCOUNTER — Other Ambulatory Visit: Payer: Self-pay | Admitting: Unknown Physician Specialty

## 2016-03-08 ENCOUNTER — Ambulatory Visit (INDEPENDENT_AMBULATORY_CARE_PROVIDER_SITE_OTHER): Payer: BLUE CROSS/BLUE SHIELD | Admitting: Unknown Physician Specialty

## 2016-03-08 ENCOUNTER — Encounter: Payer: Self-pay | Admitting: Unknown Physician Specialty

## 2016-03-08 VITALS — BP 158/105 | HR 67 | Temp 98.3°F | Wt 138.2 lb

## 2016-03-08 DIAGNOSIS — S2241XD Multiple fractures of ribs, right side, subsequent encounter for fracture with routine healing: Secondary | ICD-10-CM

## 2016-03-08 DIAGNOSIS — R0781 Pleurodynia: Secondary | ICD-10-CM

## 2016-03-08 DIAGNOSIS — I10 Essential (primary) hypertension: Secondary | ICD-10-CM | POA: Diagnosis not present

## 2016-03-08 DIAGNOSIS — S2249XA Multiple fractures of ribs, unspecified side, initial encounter for closed fracture: Secondary | ICD-10-CM | POA: Insufficient documentation

## 2016-03-08 MED ORDER — KETOROLAC TROMETHAMINE 10 MG PO TABS: 10.0000 mg | ORAL_TABLET | Freq: Four times a day (QID) | ORAL | 0 refills | Status: DC | PRN

## 2016-03-08 MED ORDER — LIDOCAINE 5 % EX PTCH: 3.0000 | MEDICATED_PATCH | CUTANEOUS | 0 refills | Status: DC

## 2016-03-08 MED ORDER — KETOROLAC TROMETHAMINE 60 MG/2ML IM SOLN
60.0000 mg | Freq: Once | INTRAMUSCULAR | Status: AC
  Administered 2016-03-08: 60 mg via INTRAMUSCULAR

## 2016-03-08 NOTE — Assessment & Plan Note (Signed)
Fracture multiple ribs left side.  Pt with exacerbation of pain.  Nodisplaced on films after reviewing ER notes.  I cannot give her additional controlled substances.  Lidoderm patches.

## 2016-03-08 NOTE — Progress Notes (Signed)
BP (!) 158/105 (BP Location: Left Arm, Patient Position: Sitting, Cuff Size: Normal)   Pulse 67   Temp 98.3 F (36.8 C)   Wt 138 lb 3.2 oz (62.7 kg)   LMP  (LMP Unknown)   SpO2 98%   BMI 25.28 kg/m    Subjective:    Patient ID: Alyssa Zuniga, female    DOB: 1965-06-29, 51 y.o.   MRN: [id number]  HPI: Alyssa Zuniga is a 51 y.o. female  Chief Complaint  Patient presents with  . Hospitalization Follow-up    pt states she has broken ribs and feels like she has done something else to it, states her pain is worse    Pt went to [facility] ER on 03/04/2016 due to severe rib pain and found to have fractures 4-6th ribs. She fell previous to this against a coffee table and again found in the bathroom after falling.  Twisted and developed sudden worsing pain.  Non-displaced fractures found in the ER.  Last night she was feeding dogs and carried water for the dogs which was too heavy and worries she "messed them up worse" and finding the pain unbearable.      Relevant past medical, surgical, family and social history reviewed and updated as indicated. Interim medical history since our last visit reviewed. Allergies and medications reviewed and updated.  Review of Systems  Per HPI unless specifically indicated above     Objective:    BP (!) 158/105 (BP Location: Left Arm, Patient Position: Sitting, Cuff Size: Normal)   Pulse 67   Temp 98.3 F (36.8 C)   Wt 138 lb 3.2 oz (62.7 kg)   LMP  (LMP Unknown)   SpO2 98%   BMI 25.28 kg/m   Wt Readings from Last 3 Encounters:  03/08/16 138 lb 3.2 oz (62.7 kg)  01/01/16 135 lb (61.2 kg)  11/03/15 130 lb 6.4 oz (59.1 kg)    Physical Exam  Constitutional: She is cooperative.  In obvious pain  Cardiovascular: Normal rate and regular rhythm.   Pulmonary/Chest: Effort normal. No respiratory distress. She has no decreased breath sounds. She has no wheezes. She has no rhonchi. She has no rales. She exhibits tenderness.  Point tender left side    Neurological: She is alert.  Psychiatric: Her speech is normal. Thought content normal.    Results for orders placed or performed in visit on 11/03/15  Thyroid Panel With TSH  Result Value Ref Range   TSH 7.400 (H) 0.450 - 4.500 uIU/mL   T4, Total 8.5 4.5 - 12.0 ug/dL   T3 Uptake Ratio 23 (L) 24 - 39 %   Free Thyroxine Index 2.0 1.2 - 4.9  HIV antibody  Result Value Ref Range   HIV Screen 4th Generation wRfx Non Reactive Non Reactive  Lipid Panel w/o Chol/HDL Ratio  Result Value Ref Range   Cholesterol, Total 288 (H) 100 - 199 mg/dL   Triglycerides 086204 (H) 0 - 149 mg/dL   HDL 44 >57>39 mg/dL   VLDL Cholesterol Cal 41 (H) 5 - 40 mg/dL   LDL Calculated 846203 (H) 0 - 99 mg/dL   Comment: Comment       Assessment & Plan:   Problem List Items Addressed This Visit      Unprioritized   Benign hypertension    Expected high BP.  RTC in next month to discuss and recheck      Multiple rib fractures    Fracture multiple ribs left side.  Pt with exacerbation of pain.  Nodisplaced on films after reviewing ER notes.  I cannot give her additional controlled substances.  Lidoderm patches.        Relevant Medications   ketorolac (TORADOL) injection 60 mg (Completed)    Other Visit Diagnoses    Rib pain    -  Primary   Relevant Medications   ketorolac (TORADOL) injection 60 mg (Completed)       Follow up plan: Return in about 4 weeks (around 04/05/2016).

## 2016-03-08 NOTE — Assessment & Plan Note (Signed)
Expected high BP.  RTC in next month to discuss and recheck

## 2016-03-09 ENCOUNTER — Other Ambulatory Visit: Payer: Self-pay | Admitting: Unknown Physician Specialty

## 2016-03-15 DIAGNOSIS — F332 Major depressive disorder, recurrent severe without psychotic features: Secondary | ICD-10-CM | POA: Diagnosis not present

## 2016-03-19 ENCOUNTER — Ambulatory Visit
Admission: EM | Admit: 2016-03-19 | Discharge: 2016-03-19 | Disposition: A | Discharge status: Home / Self Care | Payer: BLUE CROSS/BLUE SHIELD | Attending: Family Medicine | Admitting: Family Medicine

## 2016-03-19 ENCOUNTER — Encounter: Payer: Self-pay | Admitting: Gynecology

## 2016-03-19 DIAGNOSIS — H6501 Acute serous otitis media, right ear: Secondary | ICD-10-CM

## 2016-03-19 DIAGNOSIS — B9789 Other viral agents as the cause of diseases classified elsewhere: Secondary | ICD-10-CM

## 2016-03-19 DIAGNOSIS — J069 Acute upper respiratory infection, unspecified: Secondary | ICD-10-CM

## 2016-03-19 MED ORDER — BENZONATATE 100 MG PO CAPS: 100.0000 mg | ORAL_CAPSULE | Freq: Three times a day (TID) | ORAL | 0 refills | Status: DC | PRN

## 2016-03-19 MED ORDER — AZITHROMYCIN 250 MG PO TABS: ORAL_TABLET | ORAL | 0 refills | Status: DC

## 2016-03-19 NOTE — ED Provider Notes (Signed)
[facility]    CSN: [id number] Arrival date & time: 03/19/16  1449     History   Chief Complaint Chief Complaint  Patient presents with  . URI    HPI Alyssa Zuniga is a 51 y.o. female.    URI  Presenting symptoms: congestion, cough, ear pain and sore throat   Severity:  Moderate Onset quality:  Sudden Duration:  1 week Timing:  Constant Progression:  Worsening Chronicity:  New Relieved by:  None tried Associated symptoms: no headaches, no myalgias, no neck pain, no sinus pain and no wheezing   Risk factors: sick contacts   Risk factors: not elderly, no chronic cardiac disease, no chronic kidney disease, no chronic respiratory disease, no diabetes mellitus, no immunosuppression, no recent illness and no recent travel     Past Medical History:  Diagnosis Date  . Anxiety   . 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 Chronic pain    lupus, fibromyalgia  . Depression   . Fibromyalgia   . GERD (gastroesophageal reflux disease)   . Headache, common migraine   . Hypertension   . Hypothyroidism   . IBS (irritable bowel syndrome)   . Kidney stones   . Manic depression (HCC)   . Migraines    2-3x/wk  . Occasional tremors     Patient Active Problem List   Diagnosis Date Noted  . Multiple rib fractures 03/08/2016  . Dyspareunia in female 09/25/2015  . Headache disorder 06/24/2015  . Noninfectious diarrhea   . Heartburn   . Diarrhea   . Gastritis   . Arthritis, degenerative 05/28/2015  . Hypertension 04/21/2015  . Depression 04/21/2015  . Tremors of nervous system 01/14/2015  . Incontinence of bowel 01/14/2015  . Incontinence of urine 01/14/2015  . Fibromyalgia 12/15/2014  . Chronic pain 12/15/2014  . Vitamin D deficiency 12/15/2014  . Frozen shoulder 12/15/2014  . Hyperemesis 12/15/2014  . Hypothyroidism 11/05/2014  . Diaphoresis 11/05/2014  . Memory loss 11/05/2014  . Hypercholesteremia 11/05/2014  . Migraines  11/05/2014  . Insomnia 11/05/2014  . Atypical chest pain 07/07/2014  . Cephalalgia 10/02/2013  . Anxiety 05/07/2013  . Carpal tunnel syndrome 02/03/2013  . Neuropathy (HCC) 10/29/2012  . Acid reflux 10/29/2012  . Back ache 08/10/2012  . Benign hypertension 09/25/2008    Past Surgical History:  Procedure Laterality Date  . ABDOMINAL HYSTERECTOMY    . bunion removal Right   . CARDIAC CATHETERIZATION  07/05/14   [facility]  . CESAREAN SECTION    . COLONOSCOPY WITH PROPOFOL N/A 06/15/2015   Procedure: COLONOSCOPY WITH PROPOFOL;  Surgeon: Midge Minium, MD;  Location[facility];  Service: Endoscopy;  Laterality: N/A;  . ESOPHAGOGASTRODUODENOSCOPY (EGD) WITH PROPOFOL N/A 06/15/2015   Procedure: ESOPHAGOGASTRODUODENOSCOPY (EGD) WITH PROPOFOL;  Surgeon: Midge Minium, MD;  Location: [facility];  Service: Endoscopy;  Laterality: N/A;    OB History    No data available       Home Medications    Prior to Admission medications   Medication Sig Start Date End Date Taking? Authorizing Provider  calcium carbonate (TUMS EX) 750 MG chewable tablet Chew 1 tablet by mouth daily.   Yes Historical Provider, MD  cephALEXin (KEFLEX) 500 MG capsule Take 1 capsule (500 mg total) by mouth 3 (three) times daily. 01/01/16  Yes Payton Mccallum, MD  Chlorpheniramine Maleate (ALLERGY RELIEF PO) Take by mouth daily as needed.   Yes Historical Provider, MD  citalopram (CELEXA) 20 MG tablet Take 1 tablet (20 mg total) by mouth daily. 11/25/15  Yes Gabriel Cirri, NP  Cyanocobalamin (VITAMIN B-12) 500 MCG LOZG Take by mouth as needed.    Yes Historical Provider, MD  dexlansoprazole (DEXILANT) 60 MG capsule Take 60 mg by mouth daily.   Yes Historical Provider, MD  estradiol (ESTRACE) 1 MG tablet Take 1 tablet (1 mg total) by mouth daily. 02/13/15  Yes Gabriel Cirri, NP  estradiol (ESTRACE) 1 MG tablet TAKE 1 TABLET(1 MG) BY MOUTH DAILY 03/09/16  Yes Gabriel Cirri, NP  ketorolac (TORADOL) 10 MG tablet Take 1  tablet (10 mg total) by mouth every 6 (six) hours as needed. 03/08/16  Yes Gabriel Cirri, NP  levothyroxine (SYNTHROID, LEVOTHROID) 112 MCG tablet TAKE 1 TABLET BY MOUTH EVERY DAY ON AN EMPTY STOMACH 03/07/16  Yes Gabriel Cirri, NP  lidocaine (LIDODERM) 5 % Place 3 patches onto the skin daily. Remove & Discard patch within 12 hours or as directed by MD 03/08/16  Yes Gabriel Cirri, NP  LYRICA 150 MG capsule Take 150 mg by mouth 2 (two) times daily. 03/07/16  Yes Historical Provider, MD  Multiple Vitamins-Minerals (MULTI + OMEGA-3 ADULT GUMMIES PO) Take by mouth daily.   Yes Historical Provider, MD  Multiple Vitamins-Minerals (ZINC PO) Take by mouth.   Yes Historical Provider, MD  ondansetron (ZOFRAN) 8 MG tablet Take 8 mg by mouth every 8 (eight) hours as needed for nausea or vomiting.   Yes Historical Provider, MD  oxyCODONE (OXYCONTIN) 20 mg 12 hr tablet Take 20 mg by mouth every 12 (twelve) hours.   Yes Historical Provider, MD  Oxycodone HCl 10 MG TABS Take 10 mg by mouth daily.   Yes Historical Provider, MD  phenazopyridine (PYRIDIUM) 200 MG tablet Take 1 tablet (200 mg total) by mouth 3 (three) times daily as needed for pain. 02/23/15  Yes Sharman Cheek, MD  promethazine (PHENERGAN) 25 MG tablet Take 1 tablet (25 mg total) by mouth every 8 (eight) hours as needed for nausea or vomiting. 05/04/15  Yes Gabriel Cirri, NP  SUMAtriptan (IMITREX) 100 MG tablet Take 1 tablet.  May repeat once in 2 hours if headache persists or recurs. 09/24/15  Yes Adam Mliss Fritz, DO  topiramate (TOPAMAX) 50 MG tablet Take 1 tablet (50 mg total) by mouth 2 (two) times daily. 11/03/15  Yes Gabriel Cirri, NP  triamterene-hydrochlorothiazide (DYAZIDE) 37.5-25 MG capsule TAKE 1 CAPSULE BY MOUTH DAILY 03/07/16  Yes Gabriel Cirri, NP  amitriptyline (ELAVIL) 100 MG tablet TAKE 1 TABLET(100 MG) BY MOUTH AT BEDTIME Patient not taking: Reported on 03/08/2016 01/13/16   Drema Dallas, DO  azithromycin (ZITHROMAX Z-PAK) 250 MG tablet 2 tabs po once  day 1, then 1 tab po qd for next 4 days 03/19/16   Payton Mccallum, MD  benzonatate (TESSALON) 100 MG capsule Take 1 capsule (100 mg total) by mouth 3 (three) times daily as needed. 03/19/16   Payton Mccallum, MD  Magnesium 250 MG TABS Take by mouth daily.    Historical Provider, MD  Turmeric Curcumin 500 MG CAPS Take 500 mg by mouth daily.    Historical Provider, MD    Family History Family History  Problem Relation Age of Onset  . Depression Mother   . Anxiety disorder Mother   . Cancer Mother     breast  . Migraines Mother   . Stroke Father   . Heart disease Father     MI  . Alcohol abuse Father   .  Arthritis Father   . Migraines Father   . Thyroid disease Father   . Thyroid disease Maternal Grandmother   . Hyperlipidemia Maternal Grandmother   . Migraines Brother   . Anxiety disorder Brother   . Heart disease Maternal Grandfather     MI  . Hyperlipidemia Paternal Grandmother   . Thyroid disease Paternal Grandmother   . Heart disease Paternal Grandfather     MI  . Migraines Daughter   . Migraines Son   . Migraines Son   . Migraines Daughter     Social History Social History  Substance Use Topics  . Smoking status: Never Smoker  . Smokeless tobacco: Never Used  . Alcohol use No     Allergies   Sulfa antibiotics; Hydrochlorothiazide; and Penicillins   Review of Systems Review of Systems  HENT: Positive for congestion, ear pain and sore throat. Negative for sinus pain.   Respiratory: Positive for cough. Negative for wheezing.   Musculoskeletal: Negative for myalgias and neck pain.  Neurological: Negative for headaches.     Physical Exam Triage Vital Signs ED Triage Vitals  Enc Vitals Group     BP 03/19/16 1533 124/90     Pulse Rate 03/19/16 1533 88     Resp 03/19/16 1533 16     Temp 03/19/16 1533 98.1 F (36.7 C)     Temp Source 03/19/16 1533 Oral     SpO2 03/19/16 1533 99 %     Weight 03/19/16 1535 138 lb (62.6 kg)     Height 03/19/16 1535 5\' 2"   (1.575 m)     Head Circumference --      Peak Flow --      Pain Score 03/19/16 1536 7     Pain Loc --      Pain Edu? --      Excl. in GC? --    No data found.   Updated Vital Signs BP 124/90 (BP Location: Left Arm)   Pulse 88   Temp 98.1 F (36.7 C) (Oral)   Resp 16   Ht 5\' 2"  (1.575 m)   Wt 138 lb (62.6 kg)   LMP  (LMP Unknown)   SpO2 99%   BMI 25.24 kg/m   Visual Acuity Right Eye Distance:   Left Eye Distance:   Bilateral Distance:    Right Eye Near:   Left Eye Near:    Bilateral Near:     Physical Exam  Constitutional: She appears well-developed and well-nourished. No distress.  HENT:  Head: Normocephalic and atraumatic.  Right Ear: External ear and ear canal normal. Tympanic membrane is erythematous and bulging. A middle ear effusion is present.  Left Ear: External ear and ear canal normal.  Nose: Mucosal edema and rhinorrhea present. No nose lacerations, sinus tenderness, nasal deformity, septal deviation or nasal septal hematoma. No epistaxis.  No foreign bodies. Right sinus exhibits no maxillary sinus tenderness and no frontal sinus tenderness. Left sinus exhibits no maxillary sinus tenderness and no frontal sinus tenderness.  Mouth/Throat: Uvula is midline, oropharynx is clear and moist and mucous membranes are normal. No oropharyngeal exudate.  Eyes: Conjunctivae and EOM are normal. Pupils are equal, round, and reactive to light. Right eye exhibits no discharge. Left eye exhibits no discharge. No scleral icterus.  Neck: Normal range of motion. Neck supple. No thyromegaly present.  Cardiovascular: Normal rate, regular rhythm and normal heart sounds.   Pulmonary/Chest: Effort normal and breath sounds normal. No respiratory distress. She has no wheezes. She  has no rales.  Lymphadenopathy:    She has no cervical adenopathy.  Skin: She is not diaphoretic.  Nursing note and vitals reviewed.    UC Treatments / Results  Labs (all labs ordered are listed, but  only abnormal results are displayed) Labs Reviewed - No data to display  EKG  EKG Interpretation None       Radiology No results found.  Procedures Procedures (including critical care time)  Medications Ordered in UC Medications - No data to display   Initial Impression / Assessment and Plan / UC Course  I have reviewed the triage vital signs and the nursing notes.  Pertinent labs & imaging results that were available during my care of the patient were reviewed by me and considered in my medical decision making (see chart for details).       Final Clinical Impressions(s) / UC Diagnoses   Final diagnoses:  Right acute serous otitis media, recurrence not specified  Viral URI with cough    New Prescriptions Discharge Medication List as of 03/19/2016  4:02 PM    START taking these medications   Details  azithromycin (ZITHROMAX Z-PAK) 250 MG tablet 2 tabs po once day 1, then 1 tab po qd for next 4 days, Normal    benzonatate (TESSALON) 100 MG capsule Take 1 capsule (100 mg total) by mouth 3 (three) times daily as needed., Starting Sat 03/19/2016, Normal       1. diagnosis reviewed with patient 2. rx as per orders above; reviewed possible side effects, interactions, risks and benefits  3. Recommend supportive treatment with rest, fluids, otc NSAIDs prn 4. Follow-up prn if symptoms worsen or don't improve   Payton Mccallum, MD 03/19/16 [phone]

## 2016-03-19 NOTE — ED Triage Notes (Signed)
Per patient left rib pain x couple weeks ago. Patient stated seen by her doctor and his on pain medication . Per patient cough / and congestion started x couple days ago with sore throat.

## 2016-03-28 ENCOUNTER — Ambulatory Visit: Payer: BLUE CROSS/BLUE SHIELD | Admitting: Unknown Physician Specialty

## 2016-03-29 ENCOUNTER — Telehealth: Payer: Self-pay | Admitting: Unknown Physician Specialty

## 2016-03-29 DIAGNOSIS — F332 Major depressive disorder, recurrent severe without psychotic features: Secondary | ICD-10-CM | POA: Diagnosis not present

## 2016-03-29 NOTE — Telephone Encounter (Signed)
Patient missed her appt yesterday because she was unable to drive to her appt and could not drive herself to the appt.  She is unable to bath, or do anything else for herself.  She is wanting a home health agency if Alyssa Zuniga would help her obtain one.   [phone]

## 2016-03-30 NOTE — Telephone Encounter (Signed)
Elnita MaxwellCheryl do you mind documenting what you were telling me regarding this patient and a home health aide? Thanks.

## 2016-03-30 NOTE — Telephone Encounter (Signed)
Insurance will not pay for ADL care, only for skilled nursing.  We can ask for a OT evaluation.

## 2016-03-30 NOTE — Telephone Encounter (Signed)
[facility]. Tried calling patient to let her know what Elnita MaxwellCheryl said and about sending referral form to [facility]. Patient did not answer and no VM came up. Will try to call again tomorrow.

## 2016-04-01 NOTE — Telephone Encounter (Signed)
Had a note on my desk this morning to call Vira Agarhristie Ray with [facility] regarding this patient. Phone number is [phone]. Will call her today.

## 2016-04-01 NOTE — Telephone Encounter (Signed)
Called and left Alyssa Zuniga a VM asking for her to please return my call.

## 2016-04-01 NOTE — Telephone Encounter (Signed)
Alyssa Zuniga returned my call. She stated that they are able to help this patient but are unsure if it is completely covered by insurance. Alyssa Zuniga stated that they have tried to reach out to the patient and have been unsuccessful so far. She stated that they are going to continue to reach out to her and talk with her about everything. Alyssa Zuniga stated that she would keep us in the loop with everything.

## 2016-04-07 ENCOUNTER — Other Ambulatory Visit: Payer: Self-pay | Admitting: Unknown Physician Specialty

## 2016-04-08 ENCOUNTER — Other Ambulatory Visit: Payer: Self-pay | Admitting: Unknown Physician Specialty

## 2016-04-08 ENCOUNTER — Telehealth: Payer: Self-pay

## 2016-04-08 NOTE — Telephone Encounter (Signed)
Vira Agarhristie Ray with [facility] called and wanted to let us know about this patient. Lorene DyChristie stated that she spoke with the patient and that the patient told them that she was wanting someone to clean her house. Lorene DyChristie stated that [facility] does not provide this to patient because the insurance companies see this task as custodial care and do not cover it. Lorene DyChristie stated that the patient became very upset and does not understand why she pays so much for insurance but cannot get services. Will call and follow up with patient and try to find out what exactly she is wanting or needing done.

## 2016-04-13 NOTE — Telephone Encounter (Signed)
Called and spoke with patient. I explained to her that I had spoke with Vira Agarhristie Ray from [facility] and that insurance may not cover the services she was requesting. Patient stated that she understood this and stated that this is why she was not calling about it anymore. I told the patient I would look into it some more and we could figure out something at her f/up visit with Elnita Maxwellheryl. Patient also asked about her migraine medication being increased and sent in. I let the patient know that her medication was already sent in for her and that I believe she was on the max dose. She stated that she would [facility] this [facility]. She was seen early February and was supposed to f/up in a month. I scheduled the patient an appointment for 04/26/16 to follow up with Elnita Maxwellheryl on services she is requesting and about migraine medication.

## 2016-04-14 ENCOUNTER — Encounter: Payer: Self-pay | Admitting: Emergency Medicine

## 2016-04-14 ENCOUNTER — Ambulatory Visit
Admission: EM | Admit: 2016-04-14 | Discharge: 2016-04-14 | Disposition: A | Discharge status: Home / Self Care | Payer: BLUE CROSS/BLUE SHIELD | Attending: Family Medicine | Admitting: Family Medicine

## 2016-04-14 DIAGNOSIS — R51 Headache: Secondary | ICD-10-CM

## 2016-04-14 DIAGNOSIS — R251 Tremor, unspecified: Secondary | ICD-10-CM | POA: Diagnosis not present

## 2016-04-14 DIAGNOSIS — R519 Headache, unspecified: Secondary | ICD-10-CM

## 2016-04-14 NOTE — Discharge Instructions (Signed)
Due to the need of possible further imagings including MRI and CT scan patient is being sent to ED of choice

## 2016-04-14 NOTE — ED Provider Notes (Signed)
[facility]    CSN: [id number] Arrival date & time: 04/14/16  0950     History   Chief Complaint Chief Complaint  Patient presents with  . Headache    HPI Colin BentonLinda Gaile Belsito is a 51 y.o. female.   Patient is a 51 year old white female who has history of migraines but yesterday started having headache that she's describes as being the worse headache/migraine she's ever had. She also has a history of tremors or tremors never been this bad and associated closely with migraines. According to her and her significant other she's had the headache and tremor with nausea and vomiting. She does have some medicine for nausea which is controlling the vomiting. She reports the last imaging study she had done was in her 5820s this lady is now [age]. She states when she coughs her head hurts less never happened before.   The history is provided by the patient and a significant other.  Headache  Pain location:  Generalized Quality:  Sharp Radiates to:  Does not radiate Onset quality:  Sudden Duration:  2 days Timing:  Constant Progression:  Worsening Chronicity:  New Context: activity and bright light   Relieved by:  Nothing Associated symptoms: nausea and vomiting     Past Medical History:  Diagnosis Date  . Anxiety   . 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 Chronic pain    lupus, fibromyalgia  . Depression   . Fibromyalgia   . GERD (gastroesophageal reflux disease)   . Headache, common migraine   . Hypertension   . Hypothyroidism   . IBS (irritable bowel syndrome)   . Kidney stones   . Manic depression (HCC)   . Migraines    2-3x/wk  . Occasional tremors     Patient Active Problem List   Diagnosis Date Noted  . Multiple rib fractures 03/08/2016  . Dyspareunia in female 09/25/2015  . Headache disorder 06/24/2015  . Noninfectious diarrhea   . Heartburn   . Diarrhea   . Gastritis   . Arthritis, degenerative 05/28/2015  . Hypertension  04/21/2015  . Depression 04/21/2015  . Tremors of nervous system 01/14/2015  . Incontinence of bowel 01/14/2015  . Incontinence of urine 01/14/2015  . Fibromyalgia 12/15/2014  . Chronic pain 12/15/2014  . Vitamin D deficiency 12/15/2014  . Frozen shoulder 12/15/2014  . Hyperemesis 12/15/2014  . Hypothyroidism 11/05/2014  . Diaphoresis 11/05/2014  . Memory loss 11/05/2014  . Hypercholesteremia 11/05/2014  . Migraines 11/05/2014  . Insomnia 11/05/2014  . Atypical chest pain 07/07/2014  . Cephalalgia 10/02/2013  . Anxiety 05/07/2013  . Carpal tunnel syndrome 02/03/2013  . Neuropathy (HCC) 10/29/2012  . Acid reflux 10/29/2012  . Back ache 08/10/2012  . Benign hypertension 09/25/2008    Past Surgical History:  Procedure Laterality Date  . ABDOMINAL HYSTERECTOMY    . bunion removal Right   . CARDIAC CATHETERIZATION  07/05/14   [facility]  . CESAREAN SECTION    . COLONOSCOPY WITH PROPOFOL N/A 06/15/2015   Procedure: COLONOSCOPY WITH PROPOFOL;  Surgeon: Midge Miniumarren Wohl, MD;  [facility];  Service: Endoscopy;  Laterality: N/A;  . ESOPHAGOGASTRODUODENOSCOPY (EGD) WITH PROPOFOL N/A 06/15/2015   Procedure: ESOPHAGOGASTRODUODENOSCOPY (EGD) WITH PROPOFOL;  Surgeon: Midge Miniumarren Wohl, MD;  [facility];  Service: Endoscopy;  Laterality: N/A;    OB History    No data available       Home Medications    Prior to Admission  medications   Medication Sig Start Date End Date Taking? Authorizing Provider  amitriptyline (ELAVIL) 100 MG tablet TAKE 1 TABLET(100 MG) BY MOUTH AT BEDTIME 04/08/16   Gabriel Cirri, NP  azithromycin (ZITHROMAX Z-PAK) 250 MG tablet 2 tabs po once day 1, then 1 tab po qd for next 4 days 03/19/16   Payton Mccallum, MD  benzonatate (TESSALON) 100 MG capsule Take 1 capsule (100 mg total) by mouth 3 (three) times daily as needed. 03/19/16   Payton Mccallum, MD  calcium carbonate (TUMS EX) 750 MG chewable tablet Chew 1 tablet by mouth daily.    Historical  Provider, MD  cephALEXin (KEFLEX) 500 MG capsule Take 1 capsule (500 mg total) by mouth 3 (three) times daily. 01/01/16   Payton Mccallum, MD  Chlorpheniramine Maleate (ALLERGY RELIEF PO) Take by mouth daily as needed.    Historical Provider, MD  citalopram (CELEXA) 20 MG tablet Take 1 tablet (20 mg total) by mouth daily. 11/25/15   Gabriel Cirri, NP  Cyanocobalamin (VITAMIN B-12) 500 MCG LOZG Take by mouth as needed.     Historical Provider, MD  dexlansoprazole (DEXILANT) 60 MG capsule Take 60 mg by mouth daily.    Historical Provider, MD  estradiol (ESTRACE) 1 MG tablet Take 1 tablet (1 mg total) by mouth daily. 02/13/15   Gabriel Cirri, NP  estradiol (ESTRACE) 1 MG tablet TAKE 1 TABLET(1 MG) BY MOUTH DAILY 03/09/16   Gabriel Cirri, NP  ketorolac (TORADOL) 10 MG tablet Take 1 tablet (10 mg total) by mouth every 6 (six) hours as needed. 03/08/16   Gabriel Cirri, NP  levothyroxine (SYNTHROID, LEVOTHROID) 112 MCG tablet TAKE 1 TABLET BY MOUTH EVERY DAY ON AN EMPTY STOMACH 03/07/16   Gabriel Cirri, NP  lidocaine (LIDODERM) 5 % Place 3 patches onto the skin daily. Remove & Discard patch within 12 hours or as directed by MD 03/08/16   Gabriel Cirri, NP  LYRICA 150 MG capsule Take 150 mg by mouth 2 (two) times daily. 03/07/16   Historical Provider, MD  Magnesium 250 MG TABS Take by mouth daily.    Historical Provider, MD  Multiple Vitamins-Minerals (MULTI + OMEGA-3 ADULT GUMMIES PO) Take by mouth daily.    Historical Provider, MD  Multiple Vitamins-Minerals (ZINC PO) Take by mouth.    Historical Provider, MD  ondansetron (ZOFRAN) 8 MG tablet Take 8 mg by mouth every 8 (eight) hours as needed for nausea or vomiting.    Historical Provider, MD  oxyCODONE (OXYCONTIN) 20 mg 12 hr tablet Take 20 mg by mouth every 12 (twelve) hours.    Historical Provider, MD  Oxycodone HCl 10 MG TABS Take 10 mg by mouth daily.    Historical Provider, MD  phenazopyridine (PYRIDIUM) 200 MG tablet Take 1 tablet (200 mg total) by mouth 3  (three) times daily as needed for pain. 02/23/15   Sharman Cheek, MD  promethazine (PHENERGAN) 25 MG tablet Take 1 tablet (25 mg total) by mouth every 8 (eight) hours as needed for nausea or vomiting. 05/04/15   Gabriel Cirri, NP  SUMAtriptan (IMITREX) 100 MG tablet TAKE 1 TABLET BY MOUTH FOR HEADACHE- MAY REPEAT AFTER 2 HOURS IF NEEDED 04/11/16   Gabriel Cirri, NP  topiramate (TOPAMAX) 50 MG tablet Take 1 tablet (50 mg total) by mouth 2 (two) times daily. 11/03/15   Gabriel Cirri, NP  triamterene-hydrochlorothiazide (DYAZIDE) 37.5-25 MG capsule TAKE 1 CAPSULE BY MOUTH DAILY 03/07/16   Gabriel Cirri, NP  Turmeric Curcumin 500 MG CAPS Take 500 mg  by mouth daily.    Historical Provider, MD    Family History Family History  Problem Relation Age of Onset  . Depression Mother   . Anxiety disorder Mother   . Cancer Mother     breast  . Migraines Mother   . Stroke Father   . Heart disease Father     MI  . Alcohol abuse Father   . Arthritis Father   . Migraines Father   . Thyroid disease Father   . Thyroid disease Maternal Grandmother   . Hyperlipidemia Maternal Grandmother   . Migraines Brother   . Anxiety disorder Brother   . Heart disease Maternal Grandfather     MI  . Hyperlipidemia Paternal Grandmother   . Thyroid disease Paternal Grandmother   . Heart disease Paternal Grandfather     MI  . Migraines Daughter   . Migraines Son   . Migraines Son   . Migraines Daughter     Social History Social History  Substance Use Topics  . Smoking status: Never Smoker  . Smokeless tobacco: Never Used  . Alcohol use Not on file     Allergies   Sulfa antibiotics; Hydrochlorothiazide; and Penicillins   Review of Systems Review of Systems  Gastrointestinal: Positive for nausea and vomiting.  Neurological: Positive for tremors and headaches.     Physical Exam Triage Vital Signs ED Triage Vitals  Enc Vitals Group     BP 04/14/16 1021 130/79     Pulse Rate 04/14/16 1021 82      Resp 04/14/16 1021 18     Temp 04/14/16 1021 98.2 F (36.8 C)     Temp Source 04/14/16 1021 Oral     SpO2 04/14/16 1021 97 %     Weight 04/14/16 1018 144 lb (65.3 kg)     Height 04/14/16 1018 5\' 2"  (1.575 m)     Head Circumference --      Peak Flow --      Pain Score 04/14/16 1020 4     Pain Loc --      Pain Edu? --      Excl. in GC? --    No data found.   Updated Vital Signs BP 130/79   Pulse 82   Temp 98.2 F (36.8 C) (Oral)   Resp 18   Ht 5\' 2"  (1.575 m)   Wt 144 lb (65.3 kg)   LMP  (LMP Unknown)   SpO2 97%   BMI 26.34 kg/m   Visual Acuity Right Eye Distance:   Left Eye Distance:   Bilateral Distance:    Right Eye Near:   Left Eye Near:    Bilateral Near:     Physical Exam  Constitutional: She is oriented to person, place, and time. She appears well-developed and well-nourished. She appears ill. She appears distressed.  HENT:  Head: Normocephalic and atraumatic.  Right Ear: Hearing, tympanic membrane, external ear and ear canal normal.  Left Ear: Hearing, tympanic membrane, external ear and ear canal normal.  Nose: Mucosal edema and rhinorrhea present.  Mouth/Throat: Uvula is midline and mucous membranes are normal. No uvula swelling. No posterior oropharyngeal erythema.  Eyes: Pupils are equal, round, and reactive to light.  Neck: Normal range of motion. Neck supple.  Musculoskeletal: Normal range of motion.  Lymphadenopathy:    She has no cervical adenopathy.  Neurological: She is alert and oriented to person, place, and time. She displays tremor.  Skin: Skin is warm.  Psychiatric: Her mood appears  anxious.  Vitals reviewed.    UC Treatments / Results  Labs (all labs ordered are listed, but only abnormal results are displayed) Labs Reviewed - No data to display  EKG  EKG Interpretation None       Radiology No results found.  Procedures Procedures (including critical care time)  Medications Ordered in UC Medications - No data to  display   Initial Impression / Assessment and Plan / UC Course  I have reviewed the triage vital signs and the nursing notes.  Pertinent labs & imaging results that were available during my care of the patient were reviewed by me and considered in my medical decision making (see chart for details).     Due to the migraine being the worst that she ever had and accompanied with tremor that has gotten worse as well the patient would definitely benefit from possible imaging studies might need to have a CT scan and even MRI. After discussion with her and as so they've decided to take the [facility] ED.  Final Clinical Impressions(s) / UC Diagnoses   Final diagnoses:  Nonintractable episodic headache, unspecified headache type  Tremor    New Prescriptions Discharge Medication List as of 04/14/2016 11:10 AM       Note: This dictation was prepared with Dragon dictation along with smaller phrase technology. Any transcriptional errors that result from this process are unintentional.   Hassan Rowan, MD 04/14/16 1118

## 2016-04-14 NOTE — ED Triage Notes (Signed)
Patient states she has had a headache since Tuesday and yesterday she developed tremors.

## 2016-04-21 ENCOUNTER — Other Ambulatory Visit: Payer: Self-pay | Admitting: Unknown Physician Specialty

## 2016-04-26 ENCOUNTER — Ambulatory Visit: Payer: Self-pay | Admitting: Unknown Physician Specialty

## 2016-05-05 ENCOUNTER — Other Ambulatory Visit: Payer: Self-pay | Admitting: Unknown Physician Specialty

## 2016-05-05 DIAGNOSIS — M545 Low back pain: Secondary | ICD-10-CM | POA: Diagnosis not present

## 2016-05-05 DIAGNOSIS — Z79891 Long term (current) use of opiate analgesic: Secondary | ICD-10-CM | POA: Diagnosis not present

## 2016-05-05 DIAGNOSIS — G894 Chronic pain syndrome: Secondary | ICD-10-CM | POA: Diagnosis not present

## 2016-05-05 DIAGNOSIS — M25512 Pain in left shoulder: Secondary | ICD-10-CM | POA: Diagnosis not present

## 2016-05-05 DIAGNOSIS — Z5181 Encounter for therapeutic drug level monitoring: Secondary | ICD-10-CM | POA: Diagnosis not present

## 2016-05-05 DIAGNOSIS — M47816 Spondylosis without myelopathy or radiculopathy, lumbar region: Secondary | ICD-10-CM | POA: Diagnosis not present

## 2016-05-08 ENCOUNTER — Other Ambulatory Visit: Payer: Self-pay | Admitting: Unknown Physician Specialty

## 2016-05-29 ENCOUNTER — Encounter: Payer: Self-pay | Admitting: Gynecology

## 2016-05-29 ENCOUNTER — Ambulatory Visit
Admission: EM | Admit: 2016-05-29 | Discharge: 2016-05-29 | Disposition: A | Discharge status: Home / Self Care | Payer: BLUE CROSS/BLUE SHIELD | Attending: Emergency Medicine | Admitting: Emergency Medicine

## 2016-05-29 DIAGNOSIS — R300 Dysuria: Secondary | ICD-10-CM | POA: Diagnosis not present

## 2016-05-29 DIAGNOSIS — N898 Other specified noninflammatory disorders of vagina: Secondary | ICD-10-CM

## 2016-05-29 DIAGNOSIS — N3001 Acute cystitis with hematuria: Secondary | ICD-10-CM

## 2016-05-29 DIAGNOSIS — R350 Frequency of micturition: Secondary | ICD-10-CM | POA: Diagnosis not present

## 2016-05-29 LAB — URINALYSIS, COMPLETE (UACMP) WITH MICROSCOPIC
Bilirubin Urine: NEGATIVE
Glucose, UA: NEGATIVE mg/dL
Ketones, ur: NEGATIVE mg/dL
Nitrite: NEGATIVE
Specific Gravity, Urine: 1.02 (ref 1.005–1.030)
pH: 7 (ref 5.0–8.0)

## 2016-05-29 MED ORDER — KETOROLAC TROMETHAMINE 60 MG/2ML IM SOLN
30.0000 mg | Freq: Once | INTRAMUSCULAR | Status: AC
  Administered 2016-05-29: 30 mg via INTRAMUSCULAR

## 2016-05-29 MED ORDER — CIPROFLOXACIN HCL 500 MG PO TABS: 500.0000 mg | ORAL_TABLET | Freq: Two times a day (BID) | ORAL | 0 refills | Status: DC

## 2016-05-29 MED ORDER — PHENAZOPYRIDINE HCL 200 MG PO TABS: 200.0000 mg | ORAL_TABLET | Freq: Three times a day (TID) | ORAL | 0 refills | Status: DC

## 2016-05-29 NOTE — ED Provider Notes (Signed)
CSN: [id number]     Arrival date & time 05/29/16  0801 History   None    Chief Complaint  Patient presents with  . Recurrent UTI   (Consider location/radiation/quality/duration/timing/severity/associated sxs/prior Treatment) HPI 51 year old female who presents with symptoms of UTI for approximately 1 week. Patient states that she had seen a urologist about a year ago for kidney stones and was given a "reserve" prescription for cephalexin. She began to start feeling her symptoms about a week ago and went ahead and filled the prescription 2 days later. On the cephalexin now for 5 days and has not noticed any improvement in her symptoms. Symptoms include urgency frequency and burning and pain with urination. Patient does admit to having vaginal discharge but she states this is normal for her. She has had some feverish feeling but no measurable fever. He is also has some back pain but also has had a history of back pain which is not unusual for her. She has noticed some blood in her urine which she calls hemorrhaging          Past Medical History:  Diagnosis Date  . Anxiety   . 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 Chronic pain    lupus, fibromyalgia  . Depression   . Fibromyalgia   . GERD (gastroesophageal reflux disease)   . Headache, common migraine   . Hypertension   . Hypothyroidism   . IBS (irritable bowel syndrome)   . Kidney stones   . Manic depression (HCC)   . Migraines    2-3x/wk  . Occasional tremors    Past Surgical History:  Procedure Laterality Date  . ABDOMINAL HYSTERECTOMY    . bunion removal Right   . CARDIAC CATHETERIZATION  07/05/14   [facility]  . CESAREAN SECTION    . COLONOSCOPY WITH PROPOFOL N/A 06/15/2015   Procedure: COLONOSCOPY WITH PROPOFOL;  Surgeon: Midge Minium, MD;  [facility];  Service: Endoscopy;  Laterality: N/A;  . ESOPHAGOGASTRODUODENOSCOPY (EGD) WITH PROPOFOL N/A 06/15/2015   Procedure:  ESOPHAGOGASTRODUODENOSCOPY (EGD) WITH PROPOFOL;  Surgeon: Midge Minium, MD;  Location: [facility];  Service: Endoscopy;  Laterality: N/A;   Family History  Problem Relation Age of Onset  . Depression Mother   . Anxiety disorder Mother   . Cancer Mother     breast  . Migraines Mother   . Stroke Father   . Heart disease Father     MI  . Alcohol abuse Father   . Arthritis Father   . Migraines Father   . Thyroid disease Father   . Thyroid disease Maternal Grandmother   . Hyperlipidemia Maternal Grandmother   . Migraines Brother   . Anxiety disorder Brother   . Heart disease Maternal Grandfather     MI  . Hyperlipidemia Paternal Grandmother   . Thyroid disease Paternal Grandmother   . Heart disease Paternal Grandfather     MI  . Migraines Daughter   . Migraines Son   . Migraines Son   . Migraines Daughter    Social History  Substance Use Topics  . Smoking status: Never Smoker  . Smokeless tobacco: Never Used  . Alcohol use Not on file   OB History    No data available     Review of Systems  Constitutional: Positive for activity change, chills, fatigue and fever. Negative for appetite change.  Genitourinary: Positive for dysuria, frequency, hematuria, urgency and vaginal discharge.  All other  systems reviewed and are negative.   Allergies  Sulfa antibiotics; Hydrochlorothiazide; and Penicillins  Home Medications   Prior to Admission medications   Medication Sig Start Date End Date Taking? Authorizing Provider  amitriptyline (ELAVIL) 100 MG tablet TAKE 1 TABLET(100 MG) BY MOUTH AT BEDTIME 05/06/16  Yes Gabriel Cirri, NP  azithromycin (ZITHROMAX Z-PAK) 250 MG tablet 2 tabs po once day 1, then 1 tab po qd for next 4 days 03/19/16  Yes Payton Mccallum, MD  benzonatate (TESSALON) 100 MG capsule Take 1 capsule (100 mg total) by mouth 3 (three) times daily as needed. 03/19/16  Yes Payton Mccallum, MD  calcium carbonate (TUMS EX) 750 MG chewable tablet Chew 1 tablet by  mouth daily.   Yes Historical Provider, MD  cephALEXin (KEFLEX) 500 MG capsule Take 1 capsule (500 mg total) by mouth 3 (three) times daily. 01/01/16  Yes Payton Mccallum, MD  Chlorpheniramine Maleate (ALLERGY RELIEF PO) Take by mouth daily as needed.   Yes Historical Provider, MD  citalopram (CELEXA) 20 MG tablet Take 1 tablet (20 mg total) by mouth daily. 11/25/15  Yes Gabriel Cirri, NP  Cyanocobalamin (VITAMIN B-12) 500 MCG LOZG Take by mouth as needed.    Yes Historical Provider, MD  dexlansoprazole (DEXILANT) 60 MG capsule Take 60 mg by mouth daily.   Yes Historical Provider, MD  estradiol (ESTRACE) 1 MG tablet Take 1 tablet (1 mg total) by mouth daily. 02/13/15  Yes Gabriel Cirri, NP  estradiol (ESTRACE) 1 MG tablet TAKE 1 TABLET(1 MG) BY MOUTH DAILY 03/09/16  Yes Gabriel Cirri, NP  ketorolac (TORADOL) 10 MG tablet Take 1 tablet (10 mg total) by mouth every 6 (six) hours as needed. 03/08/16  Yes Gabriel Cirri, NP  levothyroxine (SYNTHROID, LEVOTHROID) 112 MCG tablet TAKE 1 TABLET BY MOUTH EVERY DAY ON AN EMPTY STOMACH 04/22/16  Yes Gabriel Cirri, NP  lidocaine (LIDODERM) 5 % Place 3 patches onto the skin daily. Remove & Discard patch within 12 hours or as directed by MD 03/08/16  Yes Gabriel Cirri, NP  LYRICA 150 MG capsule Take 150 mg by mouth 2 (two) times daily. 03/07/16  Yes Historical Provider, MD  Magnesium 250 MG TABS Take by mouth daily.   Yes Historical Provider, MD  Multiple Vitamins-Minerals (MULTI + OMEGA-3 ADULT GUMMIES PO) Take by mouth daily.   Yes Historical Provider, MD  Multiple Vitamins-Minerals (ZINC PO) Take by mouth.   Yes Historical Provider, MD  ondansetron (ZOFRAN) 8 MG tablet Take 8 mg by mouth every 8 (eight) hours as needed for nausea or vomiting.   Yes Historical Provider, MD  oxyCODONE (OXYCONTIN) 20 mg 12 hr tablet Take 20 mg by mouth every 12 (twelve) hours.   Yes Historical Provider, MD  Oxycodone HCl 10 MG TABS Take 10 mg by mouth daily.   Yes Historical Provider, MD   promethazine (PHENERGAN) 25 MG tablet Take 1 tablet (25 mg total) by mouth every 8 (eight) hours as needed for nausea or vomiting. 05/04/15  Yes Gabriel Cirri, NP  SUMAtriptan (IMITREX) 100 MG tablet TAKE 1 TABLET BY MOUTH FOR HEADACHE- MAY REPEAT AFTER 2 HOURS IF NEEDED 05/09/16  Yes Gabriel Cirri, NP  topiramate (TOPAMAX) 50 MG tablet Take 1 tablet (50 mg total) by mouth 2 (two) times daily. 11/03/15  Yes Gabriel Cirri, NP  triamterene-hydrochlorothiazide (DYAZIDE) 37.5-25 MG capsule TAKE 1 CAPSULE BY MOUTH DAILY 03/07/16  Yes Gabriel Cirri, NP  Turmeric Curcumin 500 MG CAPS Take 500 mg by mouth daily.   Yes  Historical Provider, MD  ciprofloxacin (CIPRO) 500 MG tablet Take 1 tablet (500 mg total) by mouth 2 (two) times daily. 05/29/16   Lutricia Feil, PA-C  phenazopyridine (PYRIDIUM) 200 MG tablet Take 1 tablet (200 mg total) by mouth 3 (three) times daily. 05/29/16   Lutricia Feil, PA-C   Meds Ordered and Administered this Visit   Medications  ketorolac (TORADOL) injection 30 mg (30 mg Intramuscular Given 05/29/16 0843)    BP (!) 134/95 (BP Location: Left Arm)   Pulse 65   Temp 98 F (36.7 C) (Oral)   Resp 16   Wt 144 lb (65.3 kg)   LMP  (LMP Unknown)   SpO2 100%   BMI 26.34 kg/m  No data found.   Physical Exam  Constitutional: She is oriented to person, place, and time. She appears well-developed and well-nourished. No distress.  HENT:  Head: Normocephalic.  Eyes: Pupils are equal, round, and reactive to light.  Neck: Normal range of motion.  Pulmonary/Chest: Effort normal and breath sounds normal.  Abdominal: Soft. Bowel sounds are normal. She exhibits no distension and no mass. There is no tenderness. There is no rebound and no guarding.  Musculoskeletal: Normal range of motion.  Neurological: She is alert and oriented to person, place, and time.  Skin: Skin is warm and dry. She is not diaphoretic.  Psychiatric: She has a normal mood and affect. Her behavior is normal.  Judgment and thought content normal.  Nursing note and vitals reviewed.   Urgent Care Course     Procedures (including critical care time)  Labs Review Labs Reviewed  URINALYSIS, COMPLETE (UACMP) WITH MICROSCOPIC - Abnormal; Notable for the following:       Result Value   APPearance CLOUDY (*)    Hgb urine dipstick TRACE (*)    Protein, ur TRACE (*)    Leukocytes, UA LARGE (*)    Squamous Epithelial / LPF 0-5 (*)    Bacteria, UA MANY (*)    All other components within normal limits  URINE CULTURE    Imaging Review No results found.   Visual Acuity Review  Right Eye Distance:   Left Eye Distance:   Bilateral Distance:    Right Eye Near:   Left Eye Near:    Bilateral Near:     Medications  ketorolac (TORADOL) injection 30 mg (30 mg Intramuscular Given 05/29/16 0843)      MDM   1. Acute cystitis with hematuria    Discharge Medication List as of 05/29/2016  9:18 AM    START taking these medications   Details  ciprofloxacin (CIPRO) 500 MG tablet Take 1 tablet (500 mg total) by mouth 2 (two) times daily., Starting Sun 05/29/2016, Normal      Plan: 1. Test/x-ray results and diagnosis reviewed with patient 2. rx as per orders; risks, benefits, potential side effects reviewed with patient 3. Recommend supportive treatment with Increased fluids. Cultures and sensitivities will be available in 48 hours. I told her that the cephalexin that she is currently taking is probably resistant to the bacteria that she has. Therefore we will switch antibiotics at this point time. Hopefully there will not be a resistance to the Cipro. Cipro was chosen because she is allergic to sulfa and penicillins and she states that she she is intolerable to Macrodantin which causes  metallic taste in her mouth and gives her terrible upset stomach. She's not doing better she should follow-up with her primary care physician. 4. F/u prn if  symptoms worsen or don't improve     Lutricia Feil,  PA-C 05/29/16 [phone]

## 2016-05-29 NOTE — ED Triage Notes (Signed)
Per patient was seen for UTI x 1 week . Per patient was seen and given antibiotic which she is still taking. Per patient today is the fifth day taking her antibiotic but not feeling any better.

## 2016-06-01 ENCOUNTER — Other Ambulatory Visit: Payer: Self-pay | Admitting: Unknown Physician Specialty

## 2016-06-01 LAB — URINE CULTURE: Culture: >=100000 — AB

## 2016-06-04 ENCOUNTER — Other Ambulatory Visit: Payer: Self-pay | Admitting: Unknown Physician Specialty

## 2016-06-05 ENCOUNTER — Other Ambulatory Visit: Payer: Self-pay | Admitting: Unknown Physician Specialty

## 2016-06-06 ENCOUNTER — Other Ambulatory Visit: Payer: Self-pay | Admitting: Unknown Physician Specialty

## 2016-06-07 ENCOUNTER — Other Ambulatory Visit: Payer: Self-pay | Admitting: Unknown Physician Specialty

## 2016-06-09 DIAGNOSIS — F4310 Post-traumatic stress disorder, unspecified: Secondary | ICD-10-CM | POA: Diagnosis not present

## 2016-06-09 DIAGNOSIS — Z79899 Other long term (current) drug therapy: Secondary | ICD-10-CM | POA: Diagnosis not present

## 2016-06-13 ENCOUNTER — Ambulatory Visit (INDEPENDENT_AMBULATORY_CARE_PROVIDER_SITE_OTHER): Payer: BLUE CROSS/BLUE SHIELD | Admitting: Unknown Physician Specialty

## 2016-06-13 ENCOUNTER — Other Ambulatory Visit: Payer: Self-pay | Admitting: Unknown Physician Specialty

## 2016-06-13 ENCOUNTER — Encounter: Payer: Self-pay | Admitting: Unknown Physician Specialty

## 2016-06-13 VITALS — BP 139/93 | HR 68 | Temp 98.7°F | Ht 62.1 in | Wt 140.8 lb

## 2016-06-13 DIAGNOSIS — E039 Hypothyroidism, unspecified: Secondary | ICD-10-CM | POA: Diagnosis not present

## 2016-06-13 DIAGNOSIS — R609 Edema, unspecified: Secondary | ICD-10-CM | POA: Diagnosis not present

## 2016-06-13 DIAGNOSIS — G43101 Migraine with aura, not intractable, with status migrainosus: Secondary | ICD-10-CM

## 2016-06-13 DIAGNOSIS — I10 Essential (primary) hypertension: Secondary | ICD-10-CM

## 2016-06-13 DIAGNOSIS — N3000 Acute cystitis without hematuria: Secondary | ICD-10-CM | POA: Diagnosis not present

## 2016-06-13 DIAGNOSIS — M25561 Pain in right knee: Secondary | ICD-10-CM

## 2016-06-13 MED ORDER — SPIRONOLACTONE 25 MG PO TABS: 25.0000 mg | ORAL_TABLET | Freq: Every day | ORAL | 1 refills | Status: DC

## 2016-06-13 MED ORDER — SUMATRIPTAN SUCCINATE 100 MG PO TABS: 100.0000 mg | ORAL_TABLET | ORAL | 0 refills | Status: DC | PRN

## 2016-06-13 MED ORDER — LIDOCAINE 5 % EX PTCH: 3.0000 | MEDICATED_PATCH | CUTANEOUS | 0 refills | Status: DC

## 2016-06-13 NOTE — Assessment & Plan Note (Signed)
Pain doctor willing to do an injection.  Will get a shot with the pain doctor.

## 2016-06-13 NOTE — Assessment & Plan Note (Addendum)
Not controlled.  Has not tolerated medication in the past.  Reaction to HCTZ which may help edema.  Rx for Spioronolactone

## 2016-06-13 NOTE — Assessment & Plan Note (Signed)
Refill Imitrex.  Needs to see Neurology.  Another referral made

## 2016-06-13 NOTE — Progress Notes (Signed)
BP (!) 139/93 (BP Location: Left Arm, Cuff Size: Normal)   Pulse 68   Temp 98.7 F (37.1 C)   Ht 5' 2.1" (1.577 m)   Wt 140 lb 12.8 oz (63.9 kg)   LMP  (LMP Unknown)   SpO2 97%   BMI 25.67 kg/m    Subjective:    Patient ID: Alyssa Zuniga, female    DOB: Jul 01, 1965, 51 y.o.   MRN: [id number]  HPI: Alyssa Zuniga is a 51 y.o. female  Chief Complaint  Patient presents with  . Edema    pt states that her fingers and feet have been swelling for a while  . Medication Refill    pt states she needs a refill on lidocaine patches  . Migraine    pt states she would like to discuss increasing imitrex   Pt says she comes rarely due to the fact that she has trouble getting around at times.  She is trying to cut back on her pain medications.    CC today is to recheck and make sure her UTI is gone.  States she is having issues with swelling in her feet and hands.    She changed her psychiatrist to [place].  And getting counseling and psychiatric care.    States her headaches are bad and frequent.  States they sometimes turn into migraines.  States she takes about 9/day.  Had a referral to neurologist at one time but it is not clear she went.    States she is having right knee pain.  States it almost feels like it   Relevant past medical, surgical, family and social history reviewed and updated as indicated. Interim medical history since our last visit reviewed. Allergies and medications reviewed and updated.  Review of Systems  Per HPI unless specifically indicated above     Objective:    BP (!) 139/93 (BP Location: Left Arm, Cuff Size: Normal)   Pulse 68   Temp 98.7 F (37.1 C)   Ht 5' 2.1" (1.577 m)   Wt 140 lb 12.8 oz (63.9 kg)   LMP  (LMP Unknown)   SpO2 97%   BMI 25.67 kg/m   Wt Readings from Last 3 Encounters:  06/13/16 140 lb 12.8 oz (63.9 kg)  05/29/16 144 lb (65.3 kg)  04/14/16 144 lb (65.3 kg)    Physical Exam  Constitutional: She is  oriented to person, place, and time. She appears well-developed and well-nourished. No distress.  HENT:  Head: Normocephalic and atraumatic.  Eyes: Conjunctivae and lids are normal. Right eye exhibits no discharge. Left eye exhibits no discharge. No scleral icterus.  Neck: Normal range of motion. Neck supple. No JVD present. Carotid bruit is not present.  Cardiovascular: Normal rate, regular rhythm and normal heart sounds.   Pulmonary/Chest: Effort normal and breath sounds normal.  Abdominal: Normal appearance. There is no splenomegaly or hepatomegaly.  Musculoskeletal: Normal range of motion.  Neurological: She is alert and oriented to person, place, and time.  Skin: Skin is warm, dry and intact. No rash noted. No pallor.  Psychiatric: She has a normal mood and affect. Her behavior is normal. Judgment and thought content normal.       Assessment & Plan:   Problem List Items Addressed This Visit      Unprioritized   Hypertension    Not controlled.  Has not tolerated medication in the past.  Reaction to HCTZ which may help edema.  Rx for Spioronolactone  Relevant Medications   spironolactone (ALDACTONE) 25 MG tablet   Other Relevant Orders   Comprehensive metabolic panel   Hypothyroidism   Relevant Orders   Thyroid Panel With TSH   Migraines    Refill Imitrex.  Needs to see Neurology.  Another referral made      Relevant Medications   citalopram (CELEXA) 40 MG tablet   SUMAtriptan (IMITREX) 100 MG tablet   spironolactone (ALDACTONE) 25 MG tablet   Other Relevant Orders   Ambulatory referral to Neurology   Right knee pain    Pain doctor willing to do an injection.  Will get a shot with the pain doctor.         Other Visit Diagnoses    Swelling    -  Primary   Relevant Orders   DHEA-sulfate   CBC with Differential/Platelet   Cortisol, free, Serum   Acute cystitis without hematuria       Borderline urine.  Will send for C&S.     Relevant Orders   UA/M w/rflx  Culture, Routine    ++++++++   Follow up plan: Return in about 4 weeks (around 07/11/2016).

## 2016-06-14 ENCOUNTER — Telehealth: Payer: Self-pay | Admitting: Unknown Physician Specialty

## 2016-06-14 LAB — CBC WITH DIFFERENTIAL/PLATELET
BASOS ABS: 0.1 10*3/uL (ref 0.0–0.2)
Basos: 1 %
EOS (ABSOLUTE): 0.3 10*3/uL (ref 0.0–0.4)
EOS: 4 %
HEMATOCRIT: 39.9 % (ref 34.0–46.6)
HEMOGLOBIN: 13.6 g/dL (ref 11.1–15.9)
IMMATURE GRANS (ABS): 0 10*3/uL (ref 0.0–0.1)
IMMATURE GRANULOCYTES: 0 %
LYMPHS ABS: 3.4 10*3/uL — AB (ref 0.7–3.1)
LYMPHS: 46 %
MCH: 32.2 pg (ref 26.6–33.0)
MCHC: 34.1 g/dL (ref 31.5–35.7)
MCV: 94 fL (ref 79–97)
MONOCYTES: 5 %
Monocytes Absolute: 0.4 10*3/uL (ref 0.1–0.9)
Neutrophils Absolute: 3.4 10*3/uL (ref 1.4–7.0)
Neutrophils: 44 %
Platelets: 333 10*3/uL (ref 150–379)
RBC: 4.23 x10E6/uL (ref 3.77–5.28)
RDW: 14.8 % (ref 12.3–15.4)
WBC: 7.6 10*3/uL (ref 3.4–10.8)

## 2016-06-14 LAB — COMPREHENSIVE METABOLIC PANEL
ALBUMIN: 4.5 g/dL (ref 3.5–5.5)
ALK PHOS: 95 IU/L (ref 39–117)
ALT: 34 IU/L — AB (ref 0–32)
AST: 42 IU/L — ABNORMAL HIGH (ref 0–40)
Albumin/Globulin Ratio: 1.5 (ref 1.2–2.2)
BUN / CREAT RATIO: 11 (ref 9–23)
BUN: 13 mg/dL (ref 6–24)
Bilirubin Total: 0.7 mg/dL (ref 0.0–1.2)
CO2: 25 mmol/L (ref 18–29)
CREATININE: 1.15 mg/dL — AB (ref 0.57–1.00)
Calcium: 9.2 mg/dL (ref 8.7–10.2)
Chloride: 102 mmol/L (ref 96–106)
GFR calc Af Amer: 64 mL/min/{1.73_m2} (ref >59)
GFR calc non Af Amer: 56 mL/min/{1.73_m2} — ABNORMAL LOW (ref >59)
GLUCOSE: 104 mg/dL — AB (ref 65–99)
Globulin, Total: 3 g/dL (ref 1.5–4.5)
Potassium: 3.6 mmol/L (ref 3.5–5.2)
Sodium: 144 mmol/L (ref 134–144)
TOTAL PROTEIN: 7.5 g/dL (ref 6.0–8.5)

## 2016-06-14 LAB — THYROID PANEL WITH TSH
FREE THYROXINE INDEX: 1.3 (ref 1.2–4.9)
T3 Uptake Ratio: 20 % — ABNORMAL LOW (ref 24–39)
T4 TOTAL: 6.5 ug/dL (ref 4.5–12.0)
TSH: 47.4 u[IU]/mL — AB (ref 0.450–4.500)

## 2016-06-14 LAB — DHEA-SULFATE: DHEA SO4: 11 ug/dL — AB (ref 41.2–243.7)

## 2016-06-14 LAB — CORTISOL, FREE

## 2016-06-14 NOTE — Telephone Encounter (Signed)
Discussed with patient's husband about elevated TSH and low DHEA.  If she is taking her meds will refer to endocrine.  If she is not, will restart. Her meds.

## 2016-06-15 LAB — UA/M W/RFLX CULTURE, ROUTINE
BILIRUBIN UA: NEGATIVE
Glucose, UA: NEGATIVE
KETONES UA: NEGATIVE
Nitrite, UA: NEGATIVE
PH UA: 6 (ref 5.0–7.5)
RBC UA: NEGATIVE
Specific Gravity, UA: 1.02 (ref 1.005–1.030)
UUROB: 0.2 mg/dL (ref 0.2–1.0)

## 2016-06-15 LAB — URINE CULTURE, REFLEX

## 2016-06-15 LAB — MICROSCOPIC EXAMINATION: Bacteria, UA: NONE SEEN

## 2016-06-19 ENCOUNTER — Other Ambulatory Visit: Payer: Self-pay | Admitting: Unknown Physician Specialty

## 2016-07-02 ENCOUNTER — Other Ambulatory Visit: Payer: Self-pay | Admitting: Unknown Physician Specialty

## 2016-07-03 ENCOUNTER — Other Ambulatory Visit: Payer: Self-pay | Admitting: Unknown Physician Specialty

## 2016-07-05 DIAGNOSIS — G894 Chronic pain syndrome: Secondary | ICD-10-CM | POA: Diagnosis not present

## 2016-07-05 DIAGNOSIS — Z79891 Long term (current) use of opiate analgesic: Secondary | ICD-10-CM | POA: Diagnosis not present

## 2016-07-05 DIAGNOSIS — M25512 Pain in left shoulder: Secondary | ICD-10-CM | POA: Diagnosis not present

## 2016-07-05 DIAGNOSIS — M545 Low back pain: Secondary | ICD-10-CM | POA: Diagnosis not present

## 2016-07-05 DIAGNOSIS — M1611 Unilateral primary osteoarthritis, right hip: Secondary | ICD-10-CM | POA: Diagnosis not present

## 2016-07-05 DIAGNOSIS — M47816 Spondylosis without myelopathy or radiculopathy, lumbar region: Secondary | ICD-10-CM | POA: Diagnosis not present

## 2016-07-07 DIAGNOSIS — F4310 Post-traumatic stress disorder, unspecified: Secondary | ICD-10-CM | POA: Diagnosis not present

## 2016-07-08 DIAGNOSIS — F4310 Post-traumatic stress disorder, unspecified: Secondary | ICD-10-CM | POA: Diagnosis not present

## 2016-07-11 ENCOUNTER — Ambulatory Visit: Payer: BLUE CROSS/BLUE SHIELD | Admitting: Unknown Physician Specialty

## 2016-07-13 ENCOUNTER — Ambulatory Visit (INDEPENDENT_AMBULATORY_CARE_PROVIDER_SITE_OTHER): Payer: BLUE CROSS/BLUE SHIELD | Admitting: Unknown Physician Specialty

## 2016-07-13 ENCOUNTER — Encounter: Payer: Self-pay | Admitting: Unknown Physician Specialty

## 2016-07-13 DIAGNOSIS — R42 Dizziness and giddiness: Secondary | ICD-10-CM | POA: Diagnosis not present

## 2016-07-13 DIAGNOSIS — E039 Hypothyroidism, unspecified: Secondary | ICD-10-CM | POA: Diagnosis not present

## 2016-07-13 DIAGNOSIS — G43109 Migraine with aura, not intractable, without status migrainosus: Secondary | ICD-10-CM | POA: Diagnosis not present

## 2016-07-13 DIAGNOSIS — I10 Essential (primary) hypertension: Secondary | ICD-10-CM | POA: Diagnosis not present

## 2016-07-13 NOTE — Assessment & Plan Note (Signed)
Abnormal last visit.  Husband checked on her meds.  Will recheck it today

## 2016-07-13 NOTE — Assessment & Plan Note (Signed)
Awaiting referral to neurology 

## 2016-07-13 NOTE — Progress Notes (Signed)
BP 125/76 (BP Location: Left Arm, Patient Position: Sitting, Cuff Size: Normal)   Pulse 60   Temp 98.3 F (36.8 C)   Wt 134 lb 3.2 oz (60.9 kg)   LMP  (LMP Unknown)   SpO2 94%   BMI 24.47 kg/m    Subjective:    Patient ID: Alyssa Zuniga, female    DOB: February 07, 1965, 51 y.o.   MRN: [id number]  HPI: Alyssa Zuniga is a 51 y.o. female  Chief Complaint  Patient presents with  . Follow-up    4 Week  . Dizziness  . Fatigue   Pt states she went to the ER for neck pain since last seen.  Seeing her pain doctor   She states she has a lot of dizzy spells in which she gets dizzy, fatigued, and weak.  States she is feeling light-headed at these times and has to sit down and get sweaty.  Last time it was in the afternoon around 3p but often happens later and maybe 6:30 or 7p. Migraines are stable but awaiting a neurology appt.     Hypertension Started Spironalactone for BP Average home BPs at goal   No problems or lightheadedness No chest pain with exertion or shortness of breath Swelling is improved.    Hypothyroid Very high TSH last visit.  States she was taking her medication.    Relevant past medical, surgical, family and social history reviewed and updated as indicated. Interim medical history since our last visit reviewed. Allergies and medications reviewed and updated.  Review of Systems  Per HPI unless specifically indicated above     Objective:    BP 125/76 (BP Location: Left Arm, Patient Position: Sitting, Cuff Size: Normal)   Pulse 60   Temp 98.3 F (36.8 C)   Wt 134 lb 3.2 oz (60.9 kg)   LMP  (LMP Unknown)   SpO2 94%   BMI 24.47 kg/m   Wt Readings from Last 3 Encounters:  07/13/16 134 lb 3.2 oz (60.9 kg)  06/13/16 140 lb 12.8 oz (63.9 kg)  05/29/16 144 lb (65.3 kg)    Physical Exam  Constitutional: She is oriented to person, place, and time. She appears well-developed and well-nourished. No distress.  HENT:  Head: Normocephalic and atraumatic.    Eyes: Conjunctivae and lids are normal. Right eye exhibits no discharge. Left eye exhibits no discharge. No scleral icterus.  Neck: Normal range of motion. Neck supple. No JVD present. Carotid bruit is not present.  Cardiovascular: Normal rate, regular rhythm and normal heart sounds.   Pulmonary/Chest: Effort normal and breath sounds normal.  Abdominal: Normal appearance. There is no splenomegaly or hepatomegaly.  Musculoskeletal: Normal range of motion.  Neurological: She is alert and oriented to person, place, and time.  Skin: Skin is warm, dry and intact. No rash noted. No pallor.  Psychiatric: She has a normal mood and affect. Her behavior is normal. Judgment and thought content normal.    Results for orders placed or performed in visit on 06/13/16  Microscopic Examination  Result Value Ref Range   WBC, UA 11-30 (A) 0 - 5 /hpf   RBC, UA 0-2 0 - 2 /hpf   Epithelial Cells (non renal) 0-10 0 - 10 /hpf   Bacteria, UA None seen None seen/Few  DHEA-sulfate  Result Value Ref Range   DHEA-SO4 11.0 (L) 41.2 - 243.7 ug/dL  Comprehensive metabolic panel  Result Value Ref Range   Glucose 104 (H) 65 - 99 mg/dL  BUN 13 6 - 24 mg/dL   Creatinine, Ser 1.611.15 (H) 0.57 - 1.00 mg/dL   GFR calc non Af Amer 56 (L) >59 mL/min/1.73   GFR calc Af Amer 64 >59 mL/min/1.73   BUN/Creatinine Ratio 11 9 - 23   Sodium 144 134 - 144 mmol/L   Potassium 3.6 3.5 - 5.2 mmol/L   Chloride 102 96 - 106 mmol/L   CO2 25 18 - 29 mmol/L   Calcium 9.2 8.7 - 10.2 mg/dL   Total Protein 7.5 6.0 - 8.5 g/dL   Albumin 4.5 3.5 - 5.5 g/dL   Globulin, Total 3.0 1.5 - 4.5 g/dL   Albumin/Globulin Ratio 1.5 1.2 - 2.2   Bilirubin Total 0.7 0.0 - 1.2 mg/dL   Alkaline Phosphatase 95 39 - 117 IU/L   AST 42 (H) 0 - 40 IU/L   ALT 34 (H) 0 - 32 IU/L  CBC with Differential/Platelet  Result Value Ref Range   WBC 7.6 3.4 - 10.8 x10E3/uL   RBC 4.23 3.77 - 5.28 x10E6/uL   Hemoglobin 13.6 11.1 - 15.9 g/dL   Hematocrit 09.639.9 04.534.0 -  46.6 %   MCV 94 79 - 97 fL   MCH 32.2 26.6 - 33.0 pg   MCHC 34.1 31.5 - 35.7 g/dL   RDW 40.914.8 81.112.3 - 91.415.4 %   Platelets 333 150 - 379 x10E3/uL   Neutrophils 44 Not Estab. %   Lymphs 46 Not Estab. %   Monocytes 5 Not Estab. %   Eos 4 Not Estab. %   Basos 1 Not Estab. %   Neutrophils Absolute 3.4 1.4 - 7.0 x10E3/uL   Lymphocytes Absolute 3.4 (H) 0.7 - 3.1 x10E3/uL   Monocytes Absolute 0.4 0.1 - 0.9 x10E3/uL   EOS (ABSOLUTE) 0.3 0.0 - 0.4 x10E3/uL   Basophils Absolute 0.1 0.0 - 0.2 x10E3/uL   Immature Granulocytes 0 Not Estab. %   Immature Grans (Abs) 0.0 0.0 - 0.1 x10E3/uL  Thyroid Panel With TSH  Result Value Ref Range   TSH 47.400 (H) 0.450 - 4.500 uIU/mL   T4, Total 6.5 4.5 - 12.0 ug/dL   T3 Uptake Ratio 20 (L) 24 - 39 %   Free Thyroxine Index 1.3 1.2 - 4.9  UA/M w/rflx Culture, Routine  Result Value Ref Range   Specific Gravity, UA 1.020 1.005 - 1.030   pH, UA 6.0 5.0 - 7.5   Color, UA Yellow Yellow   Appearance Ur Cloudy (A) Clear   Leukocytes, UA Trace (A) Negative   Protein, UA Trace (A) Negative/Trace   Glucose, UA Negative Negative   Ketones, UA Negative Negative   RBC, UA Negative Negative   Bilirubin, UA Negative Negative   Urobilinogen, Ur 0.2 0.2 - 1.0 mg/dL   Nitrite, UA Negative Negative   Microscopic Examination See below:    Urinalysis Reflex Comment   Cortisol, free, Serum  Result Value Ref Range   Cortisol, Free Dialysis, LCMS CANCELED ug/dL  Urine Culture, Routine  Result Value Ref Range   Urine Culture, Routine Final report    Organism ID, Bacteria Comment       Assessment & Plan:   Problem List Items Addressed This Visit      Unprioritized   Dizzy    New problem. Suspect spells of hypoglycemia with husband out of town.  Discussed change in eating plans      Hypertension    Improved with Spironalactone.        Relevant Orders   Comprehensive metabolic  panel   Hypothyroidism    Abnormal last visit.  Husband checked on her meds.  Will  recheck it today      Relevant Orders   TSH   Migraines    Awaiting referral to neurology         Disability- Will write a letter about my opinion that she is unable to hold a job at this time.    Follow up plan: Return in about 3 months (around 10/13/2016).

## 2016-07-13 NOTE — Assessment & Plan Note (Addendum)
New problem. Suspect spells of hypoglycemia with husband out of town.  Discussed change in eating plans

## 2016-07-13 NOTE — Assessment & Plan Note (Signed)
Improved with Spironalactone.

## 2016-07-14 ENCOUNTER — Other Ambulatory Visit: Payer: Self-pay | Admitting: Unknown Physician Specialty

## 2016-07-14 LAB — COMPREHENSIVE METABOLIC PANEL
A/G RATIO: 1.6 (ref 1.2–2.2)
ALT: 24 IU/L (ref 0–32)
AST: 19 IU/L (ref 0–40)
Albumin: 4.1 g/dL (ref 3.5–5.5)
Alkaline Phosphatase: 73 IU/L (ref 39–117)
BILIRUBIN TOTAL: 0.4 mg/dL (ref 0.0–1.2)
BUN/Creatinine Ratio: 12 (ref 9–23)
BUN: 11 mg/dL (ref 6–24)
CO2: 25 mmol/L (ref 20–29)
Calcium: 9.2 mg/dL (ref 8.7–10.2)
Chloride: 107 mmol/L — ABNORMAL HIGH (ref 96–106)
Creatinine, Ser: 0.95 mg/dL (ref 0.57–1.00)
GFR calc non Af Amer: 70 mL/min/{1.73_m2} (ref >59)
GFR, EST AFRICAN AMERICAN: 81 mL/min/{1.73_m2} (ref >59)
Globulin, Total: 2.6 g/dL (ref 1.5–4.5)
Glucose: 78 mg/dL (ref 65–99)
POTASSIUM: 3.6 mmol/L (ref 3.5–5.2)
Sodium: 144 mmol/L (ref 134–144)
Total Protein: 6.7 g/dL (ref 6.0–8.5)

## 2016-07-14 LAB — TSH: TSH: 1.66 u[IU]/mL (ref 0.450–4.500)

## 2016-07-14 NOTE — Telephone Encounter (Signed)
Called and spoke to patient. I asked her about the two medications and she stated that she is taking both of them. She states that she thought the spironolactone was just a fluid pill. I explained to the patient that Elnita MaxwellCheryl said that she was not supposed to be taking both of them and I told her that I would send this back to [place] to see what she wants to do. I let her know that I would call her back in the morning since Elnita MaxwellCheryl is not in the office today.

## 2016-07-14 NOTE — Telephone Encounter (Signed)
Can you call her husband and checknif she is take Aldactone or Dyazide?  She should not be on both

## 2016-07-14 NOTE — Progress Notes (Signed)
Normal labs.  Pt notified through mychart

## 2016-07-15 NOTE — Telephone Encounter (Signed)
Called patient's home number and patient's husband answered. I began explaining about the medications to the husband and he handed the phone to the patient. I let the patient know that Elnita MaxwellCheryl just wants her to take the triamterene-hctz. Patient verbalized understanding.

## 2016-07-15 NOTE — Telephone Encounter (Signed)
I would just have her take the Dyazide or Triamterene.  They are both fluid pills and I don't want her on both.  Please let her husband know as Alyssa Zuniga won't remember the conversation

## 2016-07-29 DIAGNOSIS — F4310 Post-traumatic stress disorder, unspecified: Secondary | ICD-10-CM | POA: Diagnosis not present

## 2016-08-01 ENCOUNTER — Other Ambulatory Visit: Payer: Self-pay | Admitting: Unknown Physician Specialty

## 2016-08-04 ENCOUNTER — Telehealth: Payer: Self-pay | Admitting: Unknown Physician Specialty

## 2016-08-04 DIAGNOSIS — G8929 Other chronic pain: Secondary | ICD-10-CM | POA: Diagnosis not present

## 2016-08-04 DIAGNOSIS — M797 Fibromyalgia: Secondary | ICD-10-CM | POA: Diagnosis not present

## 2016-08-04 DIAGNOSIS — G4700 Insomnia, unspecified: Secondary | ICD-10-CM | POA: Diagnosis not present

## 2016-08-04 DIAGNOSIS — Z880 Allergy status to penicillin: Secondary | ICD-10-CM | POA: Diagnosis not present

## 2016-08-04 DIAGNOSIS — F4310 Post-traumatic stress disorder, unspecified: Secondary | ICD-10-CM | POA: Diagnosis not present

## 2016-08-04 DIAGNOSIS — Z882 Allergy status to sulfonamides status: Secondary | ICD-10-CM | POA: Diagnosis not present

## 2016-08-04 DIAGNOSIS — Z79899 Other long term (current) drug therapy: Secondary | ICD-10-CM | POA: Diagnosis not present

## 2016-08-04 DIAGNOSIS — F419 Anxiety disorder, unspecified: Secondary | ICD-10-CM | POA: Diagnosis not present

## 2016-08-04 DIAGNOSIS — M549 Dorsalgia, unspecified: Secondary | ICD-10-CM | POA: Diagnosis not present

## 2016-08-04 DIAGNOSIS — F329 Major depressive disorder, single episode, unspecified: Secondary | ICD-10-CM | POA: Diagnosis not present

## 2016-08-04 DIAGNOSIS — I10 Essential (primary) hypertension: Secondary | ICD-10-CM | POA: Diagnosis not present

## 2016-08-04 NOTE — Telephone Encounter (Signed)
I'm not sure I can answer these questions over the phone.  She will need to be seen.  Neurology, which I have already referred her to, would be the place to go for the incontinence.

## 2016-08-04 NOTE — Telephone Encounter (Signed)
Called and let patient know what Elnita MaxwellCheryl said. Scheduled patient an appointment for 08/09/16 to see Elnita MaxwellCheryl. Patient states that she has not heard anything about Neurology referral. Lorina RabonKeri- can you check into this please? [facility]

## 2016-08-04 NOTE — Telephone Encounter (Signed)
Patient would like for [place] to give her a call back. She said she has a couple concerns she would like to speak with you about.  Thanks  [phone] or her home number if before 2 [phone]

## 2016-08-04 NOTE — Telephone Encounter (Signed)
Called and spoke to patient. She states that she has been using the bathroom in her pants for the last 2 weeks and is unaware of it. She states that she usually has this diarrhea when she is sick but states that she has been well. Patient also states that she has brown patches all over her body and whelps on her face. She states that she has not been able to sleep for the last 3 or 4 days and that this is when the patches and stuff started coming up. Patient states she is concerned about these things and doesn't know what she needs to do.

## 2016-08-06 ENCOUNTER — Ambulatory Visit
Admission: EM | Admit: 2016-08-06 | Discharge: 2016-08-06 | Disposition: A | Discharge status: Home / Self Care | Payer: BLUE CROSS/BLUE SHIELD | Attending: Family Medicine | Admitting: Family Medicine

## 2016-08-06 ENCOUNTER — Ambulatory Visit (INDEPENDENT_AMBULATORY_CARE_PROVIDER_SITE_OTHER): Payer: BLUE CROSS/BLUE SHIELD

## 2016-08-06 ENCOUNTER — Encounter: Payer: Self-pay | Admitting: Gynecology

## 2016-08-06 DIAGNOSIS — S79911A Unspecified injury of right hip, initial encounter: Secondary | ICD-10-CM | POA: Diagnosis not present

## 2016-08-06 DIAGNOSIS — M25551 Pain in right hip: Secondary | ICD-10-CM

## 2016-08-06 DIAGNOSIS — W19XXXA Unspecified fall, initial encounter: Secondary | ICD-10-CM

## 2016-08-06 DIAGNOSIS — S20222A Contusion of left back wall of thorax, initial encounter: Secondary | ICD-10-CM

## 2016-08-06 DIAGNOSIS — S0083XA Contusion of other part of head, initial encounter: Secondary | ICD-10-CM

## 2016-08-06 NOTE — Discharge Instructions (Signed)
Recommend continue current pain medication as directed. Recommend apply warm moist heat to area for comfort. Follow-up with your primary care provider in 2 to 3 days if not improving.

## 2016-08-06 NOTE — ED Triage Notes (Signed)
Per patient fell x 2 days ago. Patient stated that she fell asleep in the bathroom when she fell and hit her head. Patient also stated that she was cleaning her back room the same day when she fell again and injury her hip and back.

## 2016-08-06 NOTE — ED Provider Notes (Signed)
CSN: [id number]     Arrival date & time 08/06/16  1538 History   First MD Initiated Contact with Patient 08/06/16 1633     Chief Complaint  Patient presents with  . Fall   (Consider location/radiation/quality/duration/timing/severity/associated sxs/prior Treatment) 51 year old female presents with multiple falls and injuries that occurred 2 days ago. She had not slept in 72 hours- uncertain of cause so she was tired and more prone to accident/injury.She first was on a stool and lost her balance, slipped, fell and hit her right hip and left mid-back. She continues to have pain and has full range of motion of her hip but concerned over fracture. Later that day, she was sitting on the toilet when she fell asleep, fell forward and hit her head on a table. She hit at the bridge of her nose and at her right forehead area. She woke up when she hit her head and did not lose consciousness again. She denies any bleeding, vision changes, syncope, chest pain or difficulty breathing. She has a history of recurrent falls and chronic pain, fibromyalgia, depression, anxiety, thyroid disease and headaches. She is on multiple chronic medications for pain, including Lyrica, Topamax, Lidoderm patches, Oxycodone and Oxycontin daily. Unable to take Tylenol due to liver disease.    The history is provided by the patient.    Past Medical History:  Diagnosis Date  . Anxiety   . 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 Chronic pain    lupus, fibromyalgia  . Depression   . Fibromyalgia   . GERD (gastroesophageal reflux disease)   . Headache, common migraine   . Hypertension   . Hypothyroidism   . IBS (irritable bowel syndrome)   . Kidney stones   . Manic depression (HCC)   . Migraines    2-3x/wk  . Occasional tremors   . PTSD (post-traumatic stress disorder)    Past Surgical History:  Procedure Laterality Date  . ABDOMINAL HYSTERECTOMY    . bunion removal Right   . CARDIAC CATHETERIZATION   07/05/14   [facility]  . CESAREAN SECTION    . COLONOSCOPY WITH PROPOFOL N/A 06/15/2015   Procedure: COLONOSCOPY WITH PROPOFOL;  Surgeon: Midge Miniumarren Wohl, MD;  [facility];  Service: Endoscopy;  Laterality: N/A;  . ESOPHAGOGASTRODUODENOSCOPY (EGD) WITH PROPOFOL N/A 06/15/2015   Procedure: ESOPHAGOGASTRODUODENOSCOPY (EGD) WITH PROPOFOL;  Surgeon: Midge Miniumarren Wohl, MD;  [facility];  Service: Endoscopy;  Laterality: N/A;   Family History  Problem Relation Age of Onset  . Depression Mother   . Anxiety disorder Mother   . Cancer Mother        breast  . Migraines Mother   . Stroke Father   . Heart disease Father        MI  . Alcohol abuse Father   . Arthritis Father   . Migraines Father   . Thyroid disease Father   . Thyroid disease Maternal Grandmother   . Hyperlipidemia Maternal Grandmother   . Migraines Brother   . Anxiety disorder Brother   . Heart disease Maternal Grandfather        MI  . Hyperlipidemia Paternal Grandmother   . Thyroid disease Paternal Grandmother   . Heart disease Paternal Grandfather        MI  . Migraines Daughter   . Migraines Son   . Migraines Son   . Migraines Daughter    Social History  Substance Use Topics  .  Smoking status: Never Smoker  . Smokeless tobacco: Never Used  . Alcohol use No   OB History    No data available     Review of Systems  Constitutional: Positive for fatigue. Negative for activity change, appetite change and fever.  HENT: Positive for facial swelling. Negative for ear discharge, nosebleeds and trouble swallowing.   Eyes: Negative for photophobia and visual disturbance.  Respiratory: Negative for chest tightness and shortness of breath.   Cardiovascular: Negative for chest pain.  Gastrointestinal: Negative for nausea and vomiting.  Genitourinary: Negative for decreased urine volume and difficulty urinating.  Musculoskeletal: Positive for arthralgias, back pain and myalgias. Negative for joint  swelling, neck pain and neck stiffness.  Skin: Positive for wound. Negative for rash.  Neurological: Positive for headaches. Negative for dizziness, seizures, syncope, weakness and light-headedness.  Psychiatric/Behavioral: Positive for sleep disturbance. The patient is nervous/anxious.     Allergies  Sulfa antibiotics; Acetaminophen; Hydrochlorothiazide; and Penicillins  Home Medications   Prior to Admission medications   Medication Sig Start Date End Date Taking? Authorizing Provider  calcium carbonate (TUMS EX) 750 MG chewable tablet Chew 1 tablet by mouth daily.   Yes [provider]  Chlorpheniramine Maleate (ALLERGY RELIEF PO) Take by mouth daily as needed.   Yes [provider]  citalopram (CELEXA) 40 MG tablet Take 40 mg by mouth daily. 06/09/16  Yes [provider]  Cyanocobalamin (VITAMIN B-12) 500 MCG LOZG Take by mouth as needed.    Yes [provider]  dexlansoprazole (DEXILANT) 60 MG capsule Take 60 mg by mouth daily.   Yes [provider]  estradiol (ESTRACE) 1 MG tablet TAKE 1 TABLET(1 MG) BY MOUTH DAILY 06/06/16  Yes Gabriel Cirri, NP  levothyroxine (SYNTHROID, LEVOTHROID) 112 MCG tablet TAKE 1 TABLET BY MOUTH EVERY DAY ON AN EMPTY STOMACH 08/01/16  Yes Crissman, Mark A, MD  lidocaine (LIDODERM) 5 % Place 3 patches onto the skin daily. Remove & Discard patch within 12 hours or as directed by MD 06/13/16  Yes Gabriel Cirri, NP  LYRICA 150 MG capsule Take 150 mg by mouth 2 (two) times daily. 03/07/16  Yes [provider]  Magnesium 250 MG TABS Take by mouth daily.   Yes [provider]  Multiple Vitamins-Minerals (MULTI + OMEGA-3 ADULT GUMMIES PO) Take by mouth daily.   Yes [provider]  Multiple Vitamins-Minerals (ZINC PO) Take by mouth.   Yes [provider]  oxyCODONE (OXYCONTIN) 20 mg 12 hr tablet Take 20 mg by mouth every 12 (twelve) hours.   Yes [provider]  Oxycodone HCl 10 MG  TABS Take 10 mg by mouth daily.   Yes [provider]  promethazine (PHENERGAN) 25 MG tablet TAKE 1 TABLET(25 MG) BY MOUTH EVERY 8 HOURS AS NEEDED FOR NAUSEA OR VOMITING 06/07/16  Yes Gabriel Cirri, NP  spironolactone (ALDACTONE) 25 MG tablet Take 1 tablet (25 mg total) by mouth daily. 06/13/16  Yes Gabriel Cirri, NP  SUMAtriptan (IMITREX) 100 MG tablet Take 1 tablet (100 mg total) by mouth every 2 (two) hours as needed for migraine. May repeat in 2 hours if headache persists or recurs. 06/13/16  Yes Gabriel Cirri, NP  Suvorexant (BELSOMRA) 10 MG TABS Take by mouth. Take 1 to 1 and 1/2 tablets daily at bedtime   Yes [provider]  topiramate (TOPAMAX) 50 MG tablet TAKE 1 TABLET(50 MG) BY MOUTH TWICE DAILY 06/20/16  Yes Particia Nearing, PA-C  triamterene-hydrochlorothiazide (DYAZIDE) 37.5-25 MG capsule  TAKE 1 CAPSULE BY MOUTH DAILY 06/13/16  Yes Gabriel Cirri, NP  Turmeric Curcumin 500 MG CAPS Take 500 mg by mouth daily.   Yes [provider]   Meds Ordered and Administered this Visit  Medications - No data to display  BP 131/89 (BP Location: Left Arm)   Pulse 71   Temp 98.2 F (36.8 C) (Oral)   Resp 16   Ht 5\' 2"  (1.575 m)   Wt 135 lb (61.2 kg)   LMP  (LMP Unknown)   SpO2 98%   BMI 24.69 kg/m  No data found.   Physical Exam  Constitutional: She is oriented to person, place, and time. She appears well-developed and well-nourished. No distress.  Patient resting comfortably in chair. Has 4 point cane for support.   HENT:  Head: Normocephalic. Head is with abrasion and with contusion. Head is without laceration.    Right Ear: Hearing, tympanic membrane, external ear and ear canal normal.  Left Ear: Hearing, tympanic membrane, external ear and ear canal normal.  Nose: Nose normal.  Mouth/Throat: Oropharynx is clear and moist.  Small thin straight abrasion present at bridge of nose. No bleeding. Healing well. No signs of infection or irritation.  Slightly tender.  Slight swelling on right upper forehead. No distinct bruising. Tender. No laceration or abrasion.   Eyes: Conjunctivae and EOM are normal.  Neck: Normal range of motion.  Cardiovascular: Normal rate, regular rhythm and normal heart sounds.   Pulmonary/Chest: Effort normal and breath sounds normal.  Musculoskeletal: She exhibits tenderness. She exhibits no edema or deformity.       Right hip: She exhibits tenderness. She exhibits normal range of motion, normal strength, no swelling, no crepitus, no deformity and no laceration.       Thoracic back: She exhibits tenderness and pain. She exhibits normal range of motion, no swelling, no edema, no deformity, no laceration and no spasm.       Back:       Legs: Tender on left mid-thoracic area. No swelling or bruising present.  Right hip with full range of motion but pain with flexion. Tender near head of femur. No swelling or bruising present. No neuro deficits noted.   Neurological: She is alert and oriented to person, place, and time. She has normal strength. No cranial nerve deficit or sensory deficit.  Skin: Skin is warm and dry. Capillary refill takes less than 2 seconds.  Psychiatric: Her speech is normal. She is slowed.    Urgent Care Course     Procedures (including critical care time)  Labs Review Labs Reviewed - No data to display  Imaging Review Dg Hip Unilat With Pelvis 2-3 Views Right  Result Date: 08/06/2016 CLINICAL DATA:  52 y/o F; status post fall 2 days ago with right hip injury. EXAM: DG HIP (WITH OR WITHOUT PELVIS) 2-3V RIGHT COMPARISON:  None. FINDINGS: There is no evidence of hip fracture or dislocation. There is no evidence of arthropathy or other focal bone abnormality. IMPRESSION: Negative. Electronically Signed   By: Mitzi Hansen M.D.   On: 08/06/2016 17:21     Visual Acuity Review  Right Eye Distance:   Left Eye Distance:   Bilateral Distance:    Right Eye Near:   Left Eye  Near:    Bilateral Near:         MDM   1. Hip pain, right   2. Contusion of face, initial encounter   3. Contusion of left side of mid back,  initial encounter    Reviewed x-ray results of right hip with patient- no distinct fracture or dislocation present. She most likely suffered from mild contusions. Recommend apply warm moist heat to area for comfort. Continue current pain medication as directed. Discussed possibility of taking anti-inflammatory medications but patient declined due to current polypharmacy and history of liver disorder. Recommend ways to help reduce fall risk. Follow-up with her primary care provider in 2 to 3 days if not improving.     Sudie Grumbling, NP 08/06/16 2118

## 2016-08-08 NOTE — Telephone Encounter (Signed)
Thank you :)

## 2016-08-08 NOTE — Telephone Encounter (Signed)
[facility] on 07/21/16. I will call and ask patient if she's okay with [facility]. Could you route this back to me so it stays in my box?

## 2016-08-09 ENCOUNTER — Ambulatory Visit (INDEPENDENT_AMBULATORY_CARE_PROVIDER_SITE_OTHER): Payer: BLUE CROSS/BLUE SHIELD | Admitting: Unknown Physician Specialty

## 2016-08-09 ENCOUNTER — Encounter: Payer: Self-pay | Admitting: Unknown Physician Specialty

## 2016-08-09 VITALS — BP 165/93 | HR 77 | Temp 97.9°F | Wt 138.4 lb

## 2016-08-09 DIAGNOSIS — I10 Essential (primary) hypertension: Secondary | ICD-10-CM

## 2016-08-09 DIAGNOSIS — R296 Repeated falls: Secondary | ICD-10-CM | POA: Diagnosis not present

## 2016-08-09 DIAGNOSIS — R159 Full incontinence of feces: Secondary | ICD-10-CM | POA: Diagnosis not present

## 2016-08-09 NOTE — Progress Notes (Signed)
BP (!) 165/93 (BP Location: Left Arm, Cuff Size: Normal)   Pulse 77   Temp 97.9 F (36.6 C)   Wt 138 lb 6.4 oz (62.8 kg)   LMP  (LMP Unknown)   SpO2 98%   BMI 25.31 kg/m    Subjective:    Patient ID: Alyssa BentonLinda Gaile Zuniga, female    DOB: 1965/07/04, 51 y.o.   MRN: [id number]  HPI: Alyssa Zuniga is a 51 y.o. female  Chief Complaint  Patient presents with  . Encopresis    pt states that she has been having bowel incontinence recently, states she has had a couple of falls recently and wonders if the incontinence may be coming from that    Falls She went twice to the ER for falls.  The last one was on 7/7.  No fractures were noted at that time.  ER notes reviewed and negative for serious injury    Incontinence Pt states she is having incontinence of stools and doesn't realize.  I have documented this complaint in the past and referred her to neurology.  No change in her pain medications.  States one of the doctors noted that her incontinence may be related to nerve damage in her back.  [facility] Shon Millet(Adam Jaffe) and a upper and lower extremity nerve conduction study was ordered.  However, the pt states "they ended up dropping me because I missed an appointment" and needs a referral somewhere else.    Hypertension Not to goal today.  She did not take medications this AM     Relevant past medical, surgical, family and social history reviewed and updated as indicated. Interim medical history since our last visit reviewed. Allergies and medications reviewed and updated.  Review of Systems  Per HPI unless specifically indicated above     Objective:    BP (!) 165/93 (BP Location: Left Arm, Cuff Size: Normal)   Pulse 77   Temp 97.9 F (36.6 C)   Wt 138 lb 6.4 oz (62.8 kg)   LMP  (LMP Unknown)   SpO2 98%   BMI 25.31 kg/m   Wt Readings from Last 3 Encounters:  08/09/16 138 lb 6.4 oz (62.8 kg)  08/06/16 135 lb (61.2 kg)  07/13/16 134 lb 3.2 oz (60.9 kg)    Physical Exam  Constitutional: She is oriented to person, place, and time. She appears well-developed and well-nourished. No distress.  HENT:  Head: Normocephalic and atraumatic.  Eyes: Conjunctivae and lids are normal. Right eye exhibits no discharge. Left eye exhibits no discharge. No scleral icterus.  Cardiovascular: Normal rate.   Pulmonary/Chest: Effort normal.  Abdominal: Normal appearance. There is no splenomegaly or hepatomegaly.  Musculoskeletal: Normal range of motion.  Neurological: She is alert and oriented to person, place, and time.  Skin: Skin is intact. No rash noted. No pallor.  Psychiatric: She has a normal mood and affect. Her behavior is normal. Judgment and thought content normal.    Results for orders placed or performed in visit on 07/13/16  Comprehensive metabolic panel  Result Value Ref Range   Glucose 78 65 - 99 mg/dL   BUN 11 6 - 24 mg/dL   Creatinine, Ser 4.090.95 0.57 - 1.00 mg/dL   GFR calc non Af Amer 70 >59 mL/min/1.73   GFR calc Af Amer 81 >59 mL/min/1.73   BUN/Creatinine Ratio 12 9 - 23   Sodium 144 134 - 144 mmol/L   Potassium 3.6 3.5 - 5.2 mmol/L  Chloride 107 (H) 96 - 106 mmol/L   CO2 25 20 - 29 mmol/L   Calcium 9.2 8.7 - 10.2 mg/dL   Total Protein 6.7 6.0 - 8.5 g/dL   Albumin 4.1 3.5 - 5.5 g/dL   Globulin, Total 2.6 1.5 - 4.5 g/dL   Albumin/Globulin Ratio 1.6 1.2 - 2.2   Bilirubin Total 0.4 0.0 - 1.2 mg/dL   Alkaline Phosphatase 73 39 - 117 IU/L   AST 19 0 - 40 IU/L   ALT 24 0 - 32 IU/L  TSH  Result Value Ref Range   TSH 1.660 0.450 - 4.500 uIU/mL      Assessment & Plan:   Problem List Items Addressed This Visit      Unprioritized   Benign hypertension    Stressed compliance with BP medications      Incontinence of bowel - Primary   Relevant Orders   Ambulatory referral to Neurology    Other Visit Diagnoses    Falls frequently       Relevant Orders   Ambulatory referral to Neurology      Referral to neurology for both  these issues.  This was set up last year but lost to follow-up.  Court date for disability next week.  Letter provided  Follow up plan: Return if symptoms worsen or fail to improve.

## 2016-08-09 NOTE — Assessment & Plan Note (Signed)
Stressed compliance with BP medications

## 2016-08-10 NOTE — Telephone Encounter (Signed)
Referral faxed to [facility]. 

## 2016-08-12 DIAGNOSIS — F4310 Post-traumatic stress disorder, unspecified: Secondary | ICD-10-CM | POA: Diagnosis not present

## 2016-08-15 DIAGNOSIS — F4310 Post-traumatic stress disorder, unspecified: Secondary | ICD-10-CM | POA: Diagnosis not present

## 2016-08-20 ENCOUNTER — Other Ambulatory Visit: Payer: Self-pay | Admitting: Unknown Physician Specialty

## 2016-08-23 DIAGNOSIS — F4310 Post-traumatic stress disorder, unspecified: Secondary | ICD-10-CM | POA: Diagnosis not present

## 2016-08-25 ENCOUNTER — Ambulatory Visit
Admission: EM | Admit: 2016-08-25 | Discharge: 2016-08-25 | Disposition: A | Discharge status: Home / Self Care | Payer: BLUE CROSS/BLUE SHIELD | Attending: Emergency Medicine | Admitting: Emergency Medicine

## 2016-08-25 DIAGNOSIS — G43709 Chronic migraine without aura, not intractable, without status migrainosus: Secondary | ICD-10-CM | POA: Diagnosis not present

## 2016-08-25 DIAGNOSIS — J0100 Acute maxillary sinusitis, unspecified: Secondary | ICD-10-CM

## 2016-08-25 MED ORDER — FLUTICASONE PROPIONATE 50 MCG/ACT NA SUSP: 2.0000 | Freq: Every day | NASAL | 0 refills | Status: AC

## 2016-08-25 MED ORDER — CEFUROXIME AXETIL 250 MG PO TABS: ORAL_TABLET | ORAL | 0 refills | Status: DC

## 2016-08-25 MED ORDER — KETOROLAC TROMETHAMINE 60 MG/2ML IM SOLN
30.0000 mg | Freq: Once | INTRAMUSCULAR | Status: AC
  Administered 2016-08-25: 30 mg via INTRAMUSCULAR

## 2016-08-25 MED ORDER — NAPROXEN 500 MG PO TABS: 500.0000 mg | ORAL_TABLET | Freq: Two times a day (BID) | ORAL | 0 refills | Status: DC

## 2016-08-25 NOTE — ED Triage Notes (Signed)
Patient stated she has had an odor in nasal cavity like cigarette smoke or tobacco and doesn't smoke been consistent for the last 4 to 5 days/ Has chronic migraines and this odor is causing head to hurt.

## 2016-08-25 NOTE — ED Provider Notes (Addendum)
CSN: [id number]     Arrival date & time 08/25/16  1346 History   First MD Initiated Contact with Patient 08/25/16 1526     Chief Complaint  Patient presents with  . Headache   (Consider location/radiation/quality/duration/timing/severity/associated sxs/prior Treatment) HPI  This a 51 year old female with multiple comorbidities presents withComplaints of a tobacco/ cigarette type odor in her nasal cavity that she has constantly is worsening over the last 4-5 days and is causing her migrainous headaches. Has been taking sumatriptan which has not been helpful. She is tried every method that she knows to help alleviate the smell using lavender and dryer sheets to try to eliminate it but is been not been able to. She has no fever or chills. In review of her history she did have a injury to the bridge of her nose on 08/06/2016 it is healed without sequelae. He is very depressed over the inability to rid herself of the smell. She has had pain in the maxillary sinus area.       Past Medical History:  Diagnosis Date  . Anxiety   . 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 Chronic pain    lupus, fibromyalgia  . Depression   . Fibromyalgia   . GERD (gastroesophageal reflux disease)   . Headache, common migraine   . Hypertension   . Hypothyroidism   . IBS (irritable bowel syndrome)   . Kidney stones   . Manic depression (HCC)   . Migraines    2-3x/wk  . Occasional tremors   . PTSD (post-traumatic stress disorder)    Past Surgical History:  Procedure Laterality Date  . ABDOMINAL HYSTERECTOMY    . bunion removal Right   . CARDIAC CATHETERIZATION  07/05/14   [facility]  . CESAREAN SECTION    . COLONOSCOPY WITH PROPOFOL N/A 06/15/2015   Procedure: COLONOSCOPY WITH PROPOFOL;  Surgeon: Midge Minium, MD;  Location: [facility];  Service: Endoscopy;  Laterality: N/A;  . ESOPHAGOGASTRODUODENOSCOPY (EGD) WITH PROPOFOL N/A 06/15/2015   Procedure: ESOPHAGOGASTRODUODENOSCOPY (EGD)  WITH PROPOFOL;  Surgeon: Midge Minium, MD;  Location: [facility];  Service: Endoscopy;  Laterality: N/A;   Family History  Problem Relation Age of Onset  . Depression Mother   . Anxiety disorder Mother   . Cancer Mother        breast  . Migraines Mother   . Stroke Father   . Heart disease Father        MI  . Alcohol abuse Father   . Arthritis Father   . Migraines Father   . Thyroid disease Father   . Thyroid disease Maternal Grandmother   . Hyperlipidemia Maternal Grandmother   . Migraines Brother   . Anxiety disorder Brother   . Heart disease Maternal Grandfather        MI  . Hyperlipidemia Paternal Grandmother   . Thyroid disease Paternal Grandmother   . Heart disease Paternal Grandfather        MI  . Migraines Daughter   . Migraines Son   . Migraines Son   . Migraines Daughter    Social History  Substance Use Topics  . Smoking status: Never Smoker  . Smokeless tobacco: Never Used  . Alcohol use No   OB History    No data available     Review of Systems  Constitutional: Positive for activity change. Negative for chills, diaphoresis, fatigue and fever.  HENT: Positive for congestion, sinus pain and  sinus pressure.   Respiratory: Negative for cough.   All other systems reviewed and are negative.   Allergies  Sulfa antibiotics; Acetaminophen; Hydrochlorothiazide; and Penicillins  Home Medications   Prior to Admission medications   Medication Sig Start Date End Date Taking? Authorizing Provider  calcium carbonate (TUMS EX) 750 MG chewable tablet Chew 1 tablet by mouth daily.    [provider]  cefUROXime (CEFTIN) 250 MG tablet Take one tablet BID with food 08/25/16   Lutricia Feiloemer, Anaisabel Pederson P, PA-C  Chlorpheniramine Maleate (ALLERGY RELIEF PO) Take by mouth daily as needed.    [provider]  citalopram (CELEXA) 40 MG tablet Take 40 mg by mouth daily. 06/09/16   [provider]  Cyanocobalamin (VITAMIN B-12) 500 MCG LOZG Take by  mouth as needed.     [provider]  dexlansoprazole (DEXILANT) 60 MG capsule Take 60 mg by mouth daily.    [provider]  estradiol (ESTRACE) 1 MG tablet TAKE 1 TABLET(1 MG) BY MOUTH DAILY 06/06/16   Gabriel CirriWicker, Cheryl, NP  fluticasone [facility](FLONASE) 50 MCG/ACT nasal spray Place 2 sprays into both nostrils daily. 08/25/16   Lutricia Feiloemer, Chiquita Heckert P, PA-C  levothyroxine (SYNTHROID, LEVOTHROID) 112 MCG tablet TAKE 1 TABLET BY MOUTH EVERY DAY ON AN EMPTY STOMACH 08/01/16   Steele Sizerrissman, Mark A, MD  lidocaine (LIDODERM) 5 % Place 3 patches onto the skin daily. Remove & Discard patch within 12 hours or as directed by MD 06/13/16   Gabriel CirriWicker, Cheryl, NP  LYRICA 150 MG capsule Take 150 mg by mouth 2 (two) times daily. 03/07/16   [provider]  Magnesium 250 MG TABS Take by mouth daily.    [provider]  Multiple Vitamins-Minerals (MULTI + OMEGA-3 ADULT GUMMIES PO) Take by mouth daily.    [provider]  Multiple Vitamins-Minerals (ZINC PO) Take by mouth.    [provider]  naproxen (NAPROSYN) 500 MG tablet Take 1 tablet (500 mg total) by mouth 2 (two) times daily. 08/25/16   Lutricia Feiloemer, Tauni Sanks P, PA-C  oxyCODONE (OXYCONTIN) 20 mg 12 hr tablet Take 20 mg by mouth every 12 (twelve) hours.    [provider]  Oxycodone HCl 10 MG TABS Take 10 mg by mouth daily.    [provider]  promethazine (PHENERGAN) 25 MG tablet TAKE 1 TABLET(25 MG) BY MOUTH EVERY 8 HOURS AS NEEDED FOR NAUSEA OR VOMITING 08/22/16   Gabriel CirriWicker, Cheryl, NP  spironolactone (ALDACTONE) 25 MG tablet Take 1 tablet (25 mg total) by mouth daily. 06/13/16   Gabriel CirriWicker, Cheryl, NP  SUMAtriptan (IMITREX) 100 MG tablet Take 1 tablet (100 mg total) by mouth every 2 (two) hours as needed for migraine. May repeat in 2 hours if headache persists or recurs. 06/13/16   Gabriel CirriWicker, Cheryl, NP  Suvorexant (BELSOMRA) 10 MG TABS Take by mouth. Take 1 to 1 and 1/2 tablets daily at bedtime    [provider]  topiramate  (TOPAMAX) 50 MG tablet TAKE 1 TABLET(50 MG) BY MOUTH TWICE DAILY 06/20/16   Particia NearingLane, Rachel Elizabeth, PA-C  triamterene-hydrochlorothiazide (DYAZIDE) 37.5-25 MG capsule TAKE 1 CAPSULE BY MOUTH DAILY 06/13/16   Gabriel CirriWicker, Cheryl, NP  Turmeric Curcumin 500 MG CAPS Take 500 mg by mouth daily.    [provider]   Meds Ordered and Administered this Visit   Medications  ketorolac (TORADOL) injection 30 mg (30 mg Intramuscular Given 08/25/16 1556)    BP 123/84 (BP Location: Left Arm)   Pulse 87   Temp 98.4 F (  36.9 C) (Oral)   Ht 5\' 2"  (1.575 m)   Wt 135 lb (61.2 kg)   LMP  (LMP Unknown)   SpO2 97%   BMI 24.69 kg/m  No data found.   Physical Exam  Constitutional: She is oriented to person, place, and time. She appears well-developed and well-nourished. No distress.  HENT:  Head: Normocephalic.  Right Ear: External ear normal.  Left Ear: External ear normal.  Nose: Nose normal.  Mouth/Throat: Oropharynx is clear and moist.  The patient does have tenderness to percussion over the maxillary sinuses  Eyes: Pupils are equal, round, and reactive to light. Right eye exhibits no discharge. Left eye exhibits no discharge.  Neck: Normal range of motion.  Musculoskeletal: Normal range of motion.  Neurological: She is alert and oriented to person, place, and time.  Skin: Skin is warm and dry. She is not diaphoretic.  Psychiatric: She has a normal mood and affect. Her behavior is normal. Judgment and thought content normal.  Nursing note and vitals reviewed.   Urgent Care Course     Procedures (including critical care time)  Labs Review Labs Reviewed - No data to display  Imaging Review No results found.   Visual Acuity Review  Right Eye Distance:   Left Eye Distance:   Bilateral Distance:    Right Eye Near:   Left Eye Near:    Bilateral Near:    Medications  ketorolac (TORADOL) injection 30 mg (30 mg Intramuscular Given 08/25/16 1556)       MDM   1. Chronic  migraine without aura without status migrainosus, not intractable   2. Acute non-recurrent maxillary sinusitis    Discharge Medication List as of 08/25/2016  4:04 PM    START taking these medications   Details  cefUROXime (CEFTIN) 250 MG tablet Take one tablet BID with food, Normal    fluticasone (FLONASE) 50 MCG/ACT nasal spray Place 2 sprays into both nostrils daily., Starting Thu 08/25/2016, Normal    naproxen (NAPROSYN) 500 MG tablet Take 1 tablet (500 mg total) by mouth 2 (two) times daily., Starting Thu 08/25/2016, Normal      Plan: 1. Test/x-ray results and diagnosis reviewed with patient 2. rx as per orders; risks, benefits, potential side effects reviewed with patient 3. Recommend supportive treatment with Flonase daily for the next month. Will place her on a trial of antibiotics as a possibility for sinusitis from exam today and also from the smells that she is experiencing. She has an allergy to penicillin but has taken cephalosporins in the past and has not bothered her. Also give her some Naprosyn for use with her migraines. I've asked her to use this very sparingly however since she does have liver disease. She is not able to take Tylenol because of this. She has an appointment with her neurologist in 2 weeks. I have asked her to mention to him about the injury she had the bridge of her nose. She follow-up with her primary care practitioner Gabriel Cirriheryl Wicker for follow-up. 4. F/u prn if symptoms worsen or don't improve     Lutricia FeilRoemer, Chassidy Layson P, PA-C 08/25/16 1636    Lutricia Feiloemer, Chantay Whitelock P, PA-C 08/25/16 [phone]

## 2016-08-29 ENCOUNTER — Other Ambulatory Visit: Payer: Self-pay | Admitting: Family Medicine

## 2016-08-29 DIAGNOSIS — R296 Repeated falls: Secondary | ICD-10-CM | POA: Diagnosis not present

## 2016-08-29 DIAGNOSIS — R159 Full incontinence of feces: Secondary | ICD-10-CM | POA: Diagnosis not present

## 2016-08-29 NOTE — Telephone Encounter (Signed)
Last OV: 08/09/16 Next OV: 10/17/16  Lab Results  Component Value Date   TSH 1.660 07/13/2016   T4TOTAL 6.5 06/13/2016

## 2016-09-06 ENCOUNTER — Other Ambulatory Visit: Payer: Self-pay | Admitting: Unknown Physician Specialty

## 2016-09-06 MED ORDER — TRIAMTERENE-HCTZ 37.5-25 MG PO CAPS: 1.0000 | ORAL_CAPSULE | Freq: Every day | ORAL | 3 refills | Status: DC

## 2016-09-06 NOTE — Telephone Encounter (Signed)
Patient called to get a new script sent to [facility] in [place] for her Triamterene 37.5mg  HCTZ 25mg    Per patient pharmacy states told her the one they had on file was inactive  Thanks

## 2016-09-06 NOTE — Telephone Encounter (Signed)
Routing to provider  

## 2016-09-06 NOTE — Telephone Encounter (Signed)
Called and left patient a VM letting her know that her prescription has been sent in for her  

## 2016-09-07 ENCOUNTER — Telehealth: Payer: Self-pay | Admitting: Unknown Physician Specialty

## 2016-09-07 DIAGNOSIS — M25551 Pain in right hip: Secondary | ICD-10-CM | POA: Diagnosis not present

## 2016-09-07 DIAGNOSIS — G629 Polyneuropathy, unspecified: Secondary | ICD-10-CM | POA: Diagnosis not present

## 2016-09-07 DIAGNOSIS — M797 Fibromyalgia: Secondary | ICD-10-CM | POA: Diagnosis not present

## 2016-09-07 DIAGNOSIS — M25552 Pain in left hip: Secondary | ICD-10-CM | POA: Diagnosis not present

## 2016-09-07 DIAGNOSIS — G43909 Migraine, unspecified, not intractable, without status migrainosus: Secondary | ICD-10-CM | POA: Diagnosis not present

## 2016-09-07 DIAGNOSIS — Z882 Allergy status to sulfonamides status: Secondary | ICD-10-CM | POA: Diagnosis not present

## 2016-09-07 DIAGNOSIS — K219 Gastro-esophageal reflux disease without esophagitis: Secondary | ICD-10-CM | POA: Diagnosis not present

## 2016-09-07 DIAGNOSIS — Z79891 Long term (current) use of opiate analgesic: Secondary | ICD-10-CM | POA: Diagnosis not present

## 2016-09-07 DIAGNOSIS — F329 Major depressive disorder, single episode, unspecified: Secondary | ICD-10-CM | POA: Diagnosis not present

## 2016-09-07 DIAGNOSIS — G8929 Other chronic pain: Secondary | ICD-10-CM | POA: Diagnosis not present

## 2016-09-07 DIAGNOSIS — I10 Essential (primary) hypertension: Secondary | ICD-10-CM | POA: Diagnosis not present

## 2016-09-07 DIAGNOSIS — Z79899 Other long term (current) drug therapy: Secondary | ICD-10-CM | POA: Diagnosis not present

## 2016-09-07 DIAGNOSIS — M419 Scoliosis, unspecified: Secondary | ICD-10-CM | POA: Diagnosis not present

## 2016-09-07 DIAGNOSIS — E039 Hypothyroidism, unspecified: Secondary | ICD-10-CM | POA: Diagnosis not present

## 2016-09-07 DIAGNOSIS — Z880 Allergy status to penicillin: Secondary | ICD-10-CM | POA: Diagnosis not present

## 2016-09-07 NOTE — Telephone Encounter (Signed)
Patient would like to know if the fax of her x-ray reports on hip have been faxed along with a note on the cover sheet. Patient would also like to discuss with CMA or provider what she should do in regards to fax sent over  Please Advise.  Thank you

## 2016-09-08 NOTE — Telephone Encounter (Signed)
Left message on machine for pt to return call to the office.  

## 2016-09-09 DIAGNOSIS — M5441 Lumbago with sciatica, right side: Secondary | ICD-10-CM | POA: Diagnosis not present

## 2016-09-09 DIAGNOSIS — M25552 Pain in left hip: Secondary | ICD-10-CM | POA: Diagnosis not present

## 2016-09-09 DIAGNOSIS — M25551 Pain in right hip: Secondary | ICD-10-CM | POA: Diagnosis not present

## 2016-09-09 DIAGNOSIS — M5442 Lumbago with sciatica, left side: Secondary | ICD-10-CM | POA: Diagnosis not present

## 2016-09-09 NOTE — Telephone Encounter (Signed)
Paperwork was received when our system was down Wednesday and Terramuggusheryl and I were not here yesterday. Paperwork that patient faxed in placed on Cheryl's desk for review. I called the patient and apologized for not getting back to her sooner but explained what had happened. Patient states that [facility]. States she does not have anything broken but was told she has arthritis. Patient also states that they told her that she needs to see orthopedics. Patient states she is in a lot of pain.

## 2016-09-09 NOTE — Telephone Encounter (Signed)
Yes we can 

## 2016-09-09 NOTE — Telephone Encounter (Signed)
This shows mild arthritis and I will refer to Orthopedics.  She is already on Naprosyn and Oxycodone so I don't have other suggestions for pain management and can't take that aspect of her care over.

## 2016-09-09 NOTE — Telephone Encounter (Signed)
I don't see a fax or documentation in Epic about this visit

## 2016-09-09 NOTE — Telephone Encounter (Signed)
Routing back to [place] as she has now seen the fax.

## 2016-09-09 NOTE — Telephone Encounter (Signed)
Called and spoke to the patient. I let her know what Elnita MaxwellCheryl said and she states that she was on the way to orthopedics now. She states that [facility] (where [facility]) told her that the orthopedics office had walk in hours that she could go to so she was going to go up there now. I asked for the patient to call us back if we needed to refer her to an orthopedic office. Elnita MaxwellCheryl, can we cancel the orthopedic referral we entered?

## 2016-09-15 ENCOUNTER — Other Ambulatory Visit: Payer: Self-pay | Admitting: Unknown Physician Specialty

## 2016-09-18 ENCOUNTER — Other Ambulatory Visit: Payer: Self-pay | Admitting: Family Medicine

## 2016-09-19 ENCOUNTER — Other Ambulatory Visit: Payer: Self-pay | Admitting: Unknown Physician Specialty

## 2016-09-19 NOTE — Telephone Encounter (Signed)
Your patient 

## 2016-09-24 DIAGNOSIS — M5441 Lumbago with sciatica, right side: Secondary | ICD-10-CM | POA: Diagnosis not present

## 2016-09-24 DIAGNOSIS — M5442 Lumbago with sciatica, left side: Secondary | ICD-10-CM | POA: Diagnosis not present

## 2016-09-24 DIAGNOSIS — M25551 Pain in right hip: Secondary | ICD-10-CM | POA: Diagnosis not present

## 2016-09-24 DIAGNOSIS — M5136 Other intervertebral disc degeneration, lumbar region: Secondary | ICD-10-CM | POA: Diagnosis not present

## 2016-09-24 DIAGNOSIS — M5126 Other intervertebral disc displacement, lumbar region: Secondary | ICD-10-CM | POA: Diagnosis not present

## 2016-09-24 DIAGNOSIS — M25552 Pain in left hip: Secondary | ICD-10-CM | POA: Diagnosis not present

## 2016-09-24 DIAGNOSIS — M5432 Sciatica, left side: Secondary | ICD-10-CM | POA: Diagnosis not present

## 2016-09-24 DIAGNOSIS — M5431 Sciatica, right side: Secondary | ICD-10-CM | POA: Diagnosis not present

## 2016-10-04 ENCOUNTER — Other Ambulatory Visit: Payer: Self-pay | Admitting: Unknown Physician Specialty

## 2016-10-13 DIAGNOSIS — F4310 Post-traumatic stress disorder, unspecified: Secondary | ICD-10-CM | POA: Diagnosis not present

## 2016-10-16 ENCOUNTER — Other Ambulatory Visit: Payer: Self-pay | Admitting: Unknown Physician Specialty

## 2016-10-17 ENCOUNTER — Ambulatory Visit: Payer: BLUE CROSS/BLUE SHIELD | Admitting: Unknown Physician Specialty

## 2016-10-21 DIAGNOSIS — M25512 Pain in left shoulder: Secondary | ICD-10-CM | POA: Diagnosis not present

## 2016-10-21 DIAGNOSIS — M545 Low back pain: Secondary | ICD-10-CM | POA: Diagnosis not present

## 2016-10-21 DIAGNOSIS — G894 Chronic pain syndrome: Secondary | ICD-10-CM | POA: Diagnosis not present

## 2016-10-21 DIAGNOSIS — Z79891 Long term (current) use of opiate analgesic: Secondary | ICD-10-CM | POA: Diagnosis not present

## 2016-10-21 DIAGNOSIS — M47816 Spondylosis without myelopathy or radiculopathy, lumbar region: Secondary | ICD-10-CM | POA: Diagnosis not present

## 2016-10-26 ENCOUNTER — Encounter: Payer: Self-pay | Admitting: Unknown Physician Specialty

## 2016-10-26 ENCOUNTER — Ambulatory Visit (INDEPENDENT_AMBULATORY_CARE_PROVIDER_SITE_OTHER): Payer: BLUE CROSS/BLUE SHIELD | Admitting: Unknown Physician Specialty

## 2016-10-26 VITALS — BP 137/88 | HR 82 | Temp 98.4°F | Wt 141.8 lb

## 2016-10-26 DIAGNOSIS — G5603 Carpal tunnel syndrome, bilateral upper limbs: Secondary | ICD-10-CM | POA: Diagnosis not present

## 2016-10-26 DIAGNOSIS — E8940 Asymptomatic postprocedural ovarian failure: Secondary | ICD-10-CM

## 2016-10-26 DIAGNOSIS — Z1231 Encounter for screening mammogram for malignant neoplasm of breast: Secondary | ICD-10-CM

## 2016-10-26 DIAGNOSIS — G43109 Migraine with aura, not intractable, without status migrainosus: Secondary | ICD-10-CM

## 2016-10-26 DIAGNOSIS — R768 Other specified abnormal immunological findings in serum: Secondary | ICD-10-CM

## 2016-10-26 DIAGNOSIS — M797 Fibromyalgia: Secondary | ICD-10-CM

## 2016-10-26 DIAGNOSIS — F332 Major depressive disorder, recurrent severe without psychotic features: Secondary | ICD-10-CM

## 2016-10-26 DIAGNOSIS — E039 Hypothyroidism, unspecified: Secondary | ICD-10-CM

## 2016-10-26 DIAGNOSIS — Z1239 Encounter for other screening for malignant neoplasm of breast: Secondary | ICD-10-CM

## 2016-10-26 NOTE — Assessment & Plan Note (Signed)
Seeing pain management.  No f/u yet done on positive ANA.  Will check inflammatory labs again

## 2016-10-26 NOTE — Progress Notes (Signed)
BP 137/88   Pulse 82   Temp 98.4 F (36.9 C)   Wt 141 lb 12.8 oz (64.3 kg)   LMP  (LMP Unknown)   SpO2 98%   BMI 25.94 kg/m    Subjective:    Patient ID: Alyssa Zuniga, female    DOB: 1965/11/30, 51 y.o.   MRN: [id number]  HPI: Alyssa Zuniga is a 51 y.o. female  Chief Complaint  Patient presents with  . Follow-up    pt states she is just here to follow up  . Hand Pain    pt states she wants to discuss bilateral hand pain   Fibromyalgia/chronic pain Long term use of narcotics through pain management.  Most recently she has been to the [facility] clinic for evaluation of low back and hip pain.  Conservative care was recommended.  MRI of low back showing disc bulge without stenosis and hip with mild degerative changes.  Planning on starting aquatherapy.   Cause of fecal incontinence not found  Hand pain Having issues with hands that it starts through her wrist and goes into her finger. Hand swells and sometimes can't open a bottle.  Pain doctor suggested she talks to me.  Pain going on for a while but getting worse.    Hypertension Using medications without difficulty Average home BPs: Does not check  No problems or lightheadedness No chest pain with exertion or shortness of breath No Edema  Hypothyroid Up and down thyroid levels due to compliance  Relevant past medical, surgical, family and social history reviewed and updated as indicated. Interim medical history since our last visit reviewed. Allergies and medications reviewed and updated.  Review of Systems  Per HPI unless specifically indicated above     Objective:    BP 137/88   Pulse 82   Temp 98.4 F (36.9 C)   Wt 141 lb 12.8 oz (64.3 kg)   LMP  (LMP Unknown)   SpO2 98%   BMI 25.94 kg/m   Wt Readings from Last 3 Encounters:  10/26/16 141 lb 12.8 oz (64.3 kg)  08/25/16 135 lb (61.2 kg)  08/09/16 138 lb 6.4 oz (62.8 kg)    Physical Exam  Constitutional: She is oriented to person, place, and  time. She appears well-developed and well-nourished. No distress.  HENT:  Head: Normocephalic and atraumatic.  Eyes: Conjunctivae and lids are normal. Right eye exhibits no discharge. Left eye exhibits no discharge. No scleral icterus.  Neck: Normal range of motion. Neck supple. No JVD present. Carotid bruit is not present.  Cardiovascular: Normal rate, regular rhythm and normal heart sounds.   Pulmonary/Chest: Effort normal and breath sounds normal.  Abdominal: Normal appearance. There is no splenomegaly or hepatomegaly.  Musculoskeletal: Normal range of motion.  Neurological: She is alert and oriented to person, place, and time.  Skin: Skin is warm, dry and intact. No rash noted. No pallor.  Psychiatric: She has a normal mood and affect. Her behavior is normal. Judgment and thought content normal.    Results for orders placed or performed in visit on 07/13/16  Comprehensive metabolic panel  Result Value Ref Range   Glucose 78 65 - 99 mg/dL   BUN 11 6 - 24 mg/dL   Creatinine, Ser 0.95 0.57 - 1.00 mg/dL   GFR calc non Af Amer 70 >59 mL/min/1.73   GFR calc Af Amer 81 >59 mL/min/1.73   BUN/Creatinine Ratio 12 9 - 23   Sodium 144 134 - 144 mmol/L  Potassium 3.6 3.5 - 5.2 mmol/L   Chloride 107 (H) 96 - 106 mmol/L   CO2 25 20 - 29 mmol/L   Calcium 9.2 8.7 - 10.2 mg/dL   Total Protein 6.7 6.0 - 8.5 g/dL   Albumin 4.1 3.5 - 5.5 g/dL   Globulin, Total 2.6 1.5 - 4.5 g/dL   Albumin/Globulin Ratio 1.6 1.2 - 2.2   Bilirubin Total 0.4 0.0 - 1.2 mg/dL   Alkaline Phosphatase 73 39 - 117 IU/L   AST 19 0 - 40 IU/L   ALT 24 0 - 32 IU/L  TSH  Result Value Ref Range   TSH 1.660 0.450 - 4.500 uIU/mL      Assessment & Plan:   Problem List Items Addressed This Visit      Unprioritized   Carpal tunnel syndrome    Suspect the cause of wrist and hand pain.  Wear wrist splints at night.        Relevant Medications   amitriptyline (ELAVIL) 100 MG tablet   Depression   Relevant Medications     amitriptyline (ELAVIL) 100 MG tablet   Fibromyalgia    Seeing pain management.  No f/u yet done on positive ANA.  Will check inflammatory labs again      Relevant Orders   CBC with Differential/Platelet   Comprehensive metabolic panel   Sed Rate (ESR)   C-reactive protein   PTH, Intact and Calcium   B12   Hypothyroidism - Primary   Relevant Orders   TSH   Migraines    Pt needs a neurologist.  She was treated rudely at an office.  Will call them back.  If that is not possible I will make another referral      Relevant Medications   amitriptyline (ELAVIL) 100 MG tablet    Other Visit Diagnoses    Positive ANA (antinuclear antibody)       Relevant Orders   CBC with Differential/Platelet   Surgical menopause       Relevant Orders   DG Bone Density   Screening for breast cancer       Relevant Orders   MM DIGITAL SCREENING BILATERAL       Follow up plan: Return in about 6 months (around 04/25/2017).

## 2016-10-26 NOTE — Assessment & Plan Note (Signed)
Suspect the cause of wrist and hand pain.  Wear wrist splints at night.

## 2016-10-26 NOTE — Patient Instructions (Addendum)
Wist splints at night  Please do call to schedule your mammogram and bone density; the number to schedule one at either [facility] or [facility] is [phone]

## 2016-10-26 NOTE — Assessment & Plan Note (Signed)
Pt needs a neurologist.  She was treated rudely at an office.  Will call them back.  If that is not possible I will make another referral

## 2016-10-27 ENCOUNTER — Other Ambulatory Visit: Payer: Self-pay | Admitting: Unknown Physician Specialty

## 2016-10-27 MED ORDER — LEVOTHYROXINE SODIUM 112 MCG PO TABS: 112.0000 ug | ORAL_TABLET | Freq: Every day | ORAL | 3 refills | Status: DC

## 2016-10-27 NOTE — Progress Notes (Signed)
TSH high.  Refill history indicates non-compliance.  Refill meds, stress compliance, and recheck next visit

## 2016-10-28 LAB — CBC WITH DIFFERENTIAL/PLATELET
BASOS ABS: 0.1 10*3/uL (ref 0.0–0.2)
Basos: 1 %
EOS (ABSOLUTE): 0.3 10*3/uL (ref 0.0–0.4)
Eos: 4 %
HEMOGLOBIN: 14.3 g/dL (ref 11.1–15.9)
Hematocrit: 41.4 % (ref 34.0–46.6)
Immature Grans (Abs): 0 10*3/uL (ref 0.0–0.1)
Immature Granulocytes: 0 %
LYMPHS ABS: 3.9 10*3/uL — AB (ref 0.7–3.1)
Lymphs: 42 %
MCH: 31.6 pg (ref 26.6–33.0)
MCHC: 34.5 g/dL (ref 31.5–35.7)
MCV: 91 fL (ref 79–97)
MONOCYTES: 6 %
MONOS ABS: 0.6 10*3/uL (ref 0.1–0.9)
Neutrophils Absolute: 4.3 10*3/uL (ref 1.4–7.0)
Neutrophils: 47 %
PLATELETS: 432 10*3/uL — AB (ref 150–379)
RBC: 4.53 x10E6/uL (ref 3.77–5.28)
RDW: 13.9 % (ref 12.3–15.4)
WBC: 9.2 10*3/uL (ref 3.4–10.8)

## 2016-10-28 LAB — COMPREHENSIVE METABOLIC PANEL
ALK PHOS: 79 IU/L (ref 39–117)
ALT: 29 IU/L (ref 0–32)
AST: 22 IU/L (ref 0–40)
Albumin/Globulin Ratio: 1.5 (ref 1.2–2.2)
Albumin: 4.4 g/dL (ref 3.5–5.5)
BUN/Creatinine Ratio: 16 (ref 9–23)
BUN: 15 mg/dL (ref 6–24)
Bilirubin Total: 0.3 mg/dL (ref 0.0–1.2)
CO2: 22 mmol/L (ref 20–29)
CREATININE: 0.91 mg/dL (ref 0.57–1.00)
Calcium: 9.3 mg/dL (ref 8.7–10.2)
Chloride: 103 mmol/L (ref 96–106)
GFR calc Af Amer: 85 mL/min/{1.73_m2} (ref >59)
GFR calc non Af Amer: 74 mL/min/{1.73_m2} (ref >59)
GLUCOSE: 101 mg/dL — AB (ref 65–99)
Globulin, Total: 3 g/dL (ref 1.5–4.5)
Potassium: 4.1 mmol/L (ref 3.5–5.2)
Sodium: 141 mmol/L (ref 134–144)
Total Protein: 7.4 g/dL (ref 6.0–8.5)

## 2016-10-28 LAB — TSH: TSH: 41.17 u[IU]/mL — AB (ref 0.450–4.500)

## 2016-10-28 LAB — VITAMIN B12: Vitamin B-12: 471 pg/mL (ref 232–1245)

## 2016-10-28 LAB — PTH, INTACT AND CALCIUM: PTH: 18 pg/mL (ref 15–65)

## 2016-10-28 LAB — C-REACTIVE PROTEIN: CRP: 2.5 mg/L (ref 0.0–4.9)

## 2016-10-28 LAB — SEDIMENTATION RATE: SED RATE: 29 mm/h (ref 0–40)

## 2016-11-18 ENCOUNTER — Other Ambulatory Visit: Payer: Self-pay | Admitting: Unknown Physician Specialty

## 2016-12-02 DIAGNOSIS — F4310 Post-traumatic stress disorder, unspecified: Secondary | ICD-10-CM | POA: Diagnosis not present

## 2016-12-03 ENCOUNTER — Ambulatory Visit
Admission: EM | Admit: 2016-12-03 | Discharge: 2016-12-03 | Disposition: A | Discharge status: Home / Self Care | Payer: BLUE CROSS/BLUE SHIELD | Attending: Emergency Medicine | Admitting: Emergency Medicine

## 2016-12-03 ENCOUNTER — Emergency Department
Admission: EM | Admit: 2016-12-03 | Discharge: 2016-12-03 | Disposition: A | Discharge status: Home / Self Care | Payer: BLUE CROSS/BLUE SHIELD | Attending: Emergency Medicine | Admitting: Emergency Medicine

## 2016-12-03 DIAGNOSIS — R110 Nausea: Secondary | ICD-10-CM | POA: Diagnosis not present

## 2016-12-03 DIAGNOSIS — R42 Dizziness and giddiness: Secondary | ICD-10-CM | POA: Diagnosis not present

## 2016-12-03 DIAGNOSIS — R51 Headache: Secondary | ICD-10-CM | POA: Insufficient documentation

## 2016-12-03 DIAGNOSIS — R519 Headache, unspecified: Secondary | ICD-10-CM

## 2016-12-03 MED ORDER — PROMETHAZINE HCL 25 MG/ML IJ SOLN: 25.0000 mg | Freq: Once | INTRAMUSCULAR | Status: DC

## 2016-12-03 MED ORDER — KETOROLAC TROMETHAMINE 60 MG/2ML IM SOLN
30.0000 mg | Freq: Once | INTRAMUSCULAR | Status: AC
  Administered 2016-12-03: 30 mg via INTRAMUSCULAR

## 2016-12-03 MED ORDER — DIPHENHYDRAMINE HCL 50 MG PO CAPS: 50.0000 mg | ORAL_CAPSULE | Freq: Once | ORAL | Status: DC

## 2016-12-03 MED ORDER — ONDANSETRON 8 MG PO TBDP
8.0000 mg | ORAL_TABLET | Freq: Once | ORAL | Status: AC
  Administered 2016-12-03: 8 mg via ORAL

## 2016-12-03 NOTE — ED Triage Notes (Signed)
Pt came to Ed via pov c/o migraine. Pt has history of migraine but reports it is not her "typical" migraine. Pt c/o nausea, light sensitivity, blurry vision.

## 2016-12-03 NOTE — ED Triage Notes (Addendum)
Pt with migraine headache "all over" starting yesterday. Nausea, dizziness, photophobia. Pain 9/10. Pt reports taking Oxycodone, Oxycontin and Imitrex today. Reports she vomited this morning and then took Zofran. Pt is extremely sleepy in triage. Pt reports her last dose of Phenergan was last night and she ran out

## 2016-12-03 NOTE — ED Notes (Addendum)
Immediately upon finishing triaging patient, pt told that she will be getting a CT scan. Pt told she will be placed in lobby until CT comes/bed ready. Pt states "That's enough, I am going home. I am not going back into any damn lobby." Pt stormed out of triage room and went to parking lot.

## 2016-12-03 NOTE — ED Provider Notes (Signed)
HPI  SUBJECTIVE:  Alyssa Zuniga is a 51 y.o. female who reports an acute onset severe diffuse headache starting yesterday.  She states that is completely new and different than her usual migraines.  She reports dizziness described as vertigo, nausea and 4 episodes of emesis.  States this is worse with light, noise, smells, no alleviating factors.  Did not occur during exertion.  Reports N/V, photophobia,  Phonophobia, blurry vison,  Also nasal congestion from crying.  No fevers,  sinus pain/pressure, ear pain, jaw pain, purulent nasal d/c, dental pain,  dysarthria, focal weakness, facial droop, discoordination. No body aches, neck stiffness, rash. No mental status changes, seizures, syncope. Denies eye strain. No h/o HIV.   Tried triptan, Zofran, ice packs, oxycodone/oxycontin as prescribed her for chronic pain.  Has not tried NSAIDS, tylenol. Past medical history negative for aneurysm, HIV, sinusitis, glaucoma, stroke, atrial fibrillation or temporal arteritis. Pt is not on any antiplatelets/anticoagulants. FH father stoke at age 83. paternal relatives with strokes. No family history of aneurysm.   Taking topomax as preventative for migraines.  PMH: HTN, migraines, fibromyalgia, hypothyroid. No h/o DM. LMP: hyserectomy PMD: Gabriel Cirri, NP Neurologist: Dr. Dot Lanes   Past Medical History:  Diagnosis Date  . Anxiety   . 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 Chronic pain    lupus, fibromyalgia  . Depression   . Fibromyalgia   . GERD (gastroesophageal reflux disease)   . Headache, common migraine   . Hypertension   . Hypothyroidism   . IBS (irritable bowel syndrome)   . Kidney stones   . Manic depression (HCC)   . Migraines    2-3x/wk  . Occasional tremors   . PTSD (post-traumatic stress disorder)     Past Surgical History:  Procedure Laterality Date  . ABDOMINAL HYSTERECTOMY    . bunion removal Right   . CARDIAC CATHETERIZATION  07/05/14   [facility]   . CESAREAN SECTION    . COLONOSCOPY WITH PROPOFOL N/A 06/15/2015   Procedure: COLONOSCOPY WITH PROPOFOL;  Surgeon: Midge Minium, MD;  Location: [facility];  Service: Endoscopy;  Laterality: N/A;  . ESOPHAGOGASTRODUODENOSCOPY (EGD) WITH PROPOFOL N/A 06/15/2015   Procedure: ESOPHAGOGASTRODUODENOSCOPY (EGD) WITH PROPOFOL;  Surgeon: Midge Minium, MD;  Location: [facility];  Service: Endoscopy;  Laterality: N/A;    Family History  Problem Relation Age of Onset  . Depression Mother   . Anxiety disorder Mother   . Cancer Mother        breast  . Migraines Mother   . Stroke Father   . Heart disease Father        MI  . Alcohol abuse Father   . Arthritis Father   . Migraines Father   . Thyroid disease Father   . Thyroid disease Maternal Grandmother   . Hyperlipidemia Maternal Grandmother   . Migraines Brother   . Anxiety disorder Brother   . Heart disease Maternal Grandfather        MI  . Hyperlipidemia Paternal Grandmother   . Thyroid disease Paternal Grandmother   . Heart disease Paternal Grandfather        MI  . Migraines Daughter   . Migraines Son   . Migraines Son   . Migraines Daughter     Social History  Substance Use Topics  . Smoking status: Never Smoker  . Smokeless tobacco: Never Used  . Alcohol use 0.0 oz/week     Comment: rare  No current facility-administered medications for this encounter.   Current Outpatient Prescriptions:  .  ondansetron (ZOFRAN) 8 MG tablet, Take by mouth every 8 (eight) hours as needed for nausea or vomiting., Disp: , Rfl:  .  amitriptyline (ELAVIL) 100 MG tablet, Take 100 mg by mouth daily., Disp: , Rfl: 0 .  calcium carbonate (TUMS EX) 750 MG chewable tablet, Chew 1 tablet by mouth daily., Disp: , Rfl:  .  Chlorpheniramine Maleate (ALLERGY RELIEF PO), Take by mouth daily as needed., Disp: , Rfl:  .  citalopram (CELEXA) 40 MG tablet, Take 40 mg by mouth daily., Disp: , Rfl: 1 .  Cyanocobalamin (VITAMIN B-12) 500 MCG  LOZG, Take by mouth as needed. , Disp: , Rfl:  .  estradiol (ESTRACE) 1 MG tablet, TAKE 1 TABLET(1 MG) BY MOUTH DAILY, Disp: 90 tablet, Rfl: 0 .  fluticasone (FLONASE) 50 MCG/ACT nasal spray, Place 2 sprays into both nostrils daily., Disp: 16 g, Rfl: 0 .  levothyroxine (SYNTHROID, LEVOTHROID) 112 MCG tablet, Take 1 tablet (112 mcg total) by mouth daily before breakfast., Disp: 90 tablet, Rfl: 3 .  lidocaine (LIDODERM) 5 %, Place 3 patches onto the skin daily. Remove & Discard patch within 12 hours or as directed by MD, Disp: 90 patch, Rfl: 0 .  LYRICA 150 MG capsule, Take 150 mg by mouth 2 (two) times daily., Disp: , Rfl: 4 .  Magnesium 250 MG TABS, Take by mouth daily., Disp: , Rfl:  .  Multiple Vitamins-Minerals (MULTI + OMEGA-3 ADULT GUMMIES PO), Take by mouth daily., Disp: , Rfl:  .  Multiple Vitamins-Minerals (ZINC PO), Take by mouth., Disp: , Rfl:  .  naproxen (NAPROSYN) 500 MG tablet, TAKE 1 TABLET(500 MG) BY MOUTH TWICE DAILY, Disp: 30 tablet, Rfl: 0 .  oxyCODONE (OXYCONTIN) 20 mg 12 hr tablet, Take 20 mg by mouth every 12 (twelve) hours., Disp: , Rfl:  .  Oxycodone HCl 10 MG TABS, Take 10 mg by mouth daily., Disp: , Rfl:  .  promethazine (PHENERGAN) 25 MG tablet, TAKE 1 TABLET(25 MG) BY MOUTH EVERY 8 HOURS AS NEEDED FOR NAUSEA OR VOMITING, Disp: 20 tablet, Rfl: 0 .  spironolactone (ALDACTONE) 25 MG tablet, Take 1 tablet (25 mg total) by mouth daily., Disp: 30 tablet, Rfl: 1 .  SUMAtriptan (IMITREX) 100 MG tablet, Take 1 tablet (100 mg total) by mouth every 2 (two) hours as needed for migraine. May repeat in 2 hours if headache persists or recurs., Disp: 20 tablet, Rfl: 0 .  topiramate (TOPAMAX) 50 MG tablet, TAKE 1 TABLET(50 MG) BY MOUTH TWICE DAILY, Disp: 180 tablet, Rfl: 0 .  triamterene-hydrochlorothiazide (DYAZIDE) 37.5-25 MG capsule, Take 1 each (1 capsule total) by mouth daily., Disp: 90 capsule, Rfl: 3 .  Turmeric Curcumin 500 MG CAPS, Take 500 mg by mouth daily., Disp: , Rfl:    Allergies  Allergen Reactions  . Sulfa Antibiotics Nausea And Vomiting and Diarrhea    Other reaction(s): Vomiting  . Sulfasalazine Diarrhea, Nausea And Vomiting and Other (See Comments)    Other reaction(s): Vomiting  . Acetaminophen Other (See Comments)    "caused liver issues"  . Penicillins Rash    Other reaction(s): Unknown Pt not sure of the reaction, it was a childhood allergy.  . Sulfacetamide Sodium Nausea And Vomiting     ROS  As noted in HPI.   Physical Exam  BP (!) 149/90 (BP Location: Left Arm)   Pulse 81   Temp 98.2 F (36.8 C) (Oral)  Resp 16   Ht 5\' 3"  (1.6 m)   Wt 135 lb (61.2 kg)   LMP  (LMP Unknown)   SpO2 95%   BMI 23.91 kg/m   Constitutional: Well developed, well nourished.  Phobic.  Appears very sleepy. Eyes: PERRL, EOMI, conjunctiva normal bilaterally. Fundoscopic no hemorrhages.  Unable to adequately visualize optic disks. HENT: Normocephalic, atraumatic,mucus membranes moist, normal dentition.   No TMJ tenderness. Normal oropharynx no nasal congestion, minimal maxillary and frontal sinus tenderness. No temporal artery tenderness.  Neck: no cervical LN + trapezial muscle tenderness laterally, patient states this is her fibromyalgia. No meningismus Respiratory: normal inspiratory effort Cardiovascular: Normal rate GI:  nondistended skin: No rash, skin intact Musculoskeletal: No edema, no tenderness, no deformities Neurologic: Alert & oriented x 3, CN II-XII intact, romberg neg, finger-> nose, heel-> shin equal b/l, Romberg neg, she is unable to perform tandem gait states that she needs a cane to ambulate due to a bad hip Psychiatric: Speech and behavior appropriate   ED Course   Medications  ketorolac (TORADOL) injection 30 mg (30 mg Intramuscular Given 12/03/16 1355)  ondansetron (ZOFRAN-ODT) disintegrating tablet 8 mg (8 mg Oral Given 12/03/16 1356)    No orders of the defined types were placed in this encounter.  No results found  for this or any previous visit (from the past 24 hour(s)). No results found.   ED Clinical Impression  Acute nonintractable headache, unspecified headache type  ED Assessment/Plan   [facility] [place] [facility] for this patient consulted .  Patient is on long-term opiate therapy from a single prescriber.  Filled 60 OxyContin 20 mg on 11/1, 120 oxycodone 10 mg on 1027.  Concern for serious cause of her headache since she says that this was an acute onset, thunderclap headache, that is completely new and different than her usual migraines.  She states that this is the worst headache she is ever had.  Transferring to the ED for comprehensive evaluation.  CT not available here today.  Giving Zofran 8 mg and Toradol 30 mg IM.  Will defer further treatment to the ED.  [facility]  Feel the patient is stable to go by private vehicle.  She is completely neurologically intact here.  Discussed with Selena BattenKim, Consulting civil engineercharge RN.  Meds ordered this encounter  Medications  . ondansetron (ZOFRAN) 8 MG tablet    Sig: Take by mouth every 8 (eight) hours as needed for nausea or vomiting.  Marland Kitchen. ketorolac (TORADOL) injection 30 mg  . DISCONTD: promethazine (PHENERGAN) injection 25 mg  . DISCONTD: diphenhydrAMINE (BENADRYL) capsule 50 mg  . ondansetron (ZOFRAN-ODT) disintegrating tablet 8 mg    *This clinic note was created using Scientist, clinical (histocompatibility and immunogenetics)Dragon dictation software. Therefore, there may be occasional mistakes despite careful proofreading.  ?   Domenick GongMortenson, Yon Schiffman, MD 12/03/16 [phone]

## 2016-12-15 ENCOUNTER — Other Ambulatory Visit: Payer: Self-pay | Admitting: Unknown Physician Specialty

## 2016-12-17 ENCOUNTER — Other Ambulatory Visit: Payer: Self-pay | Admitting: Unknown Physician Specialty

## 2016-12-22 ENCOUNTER — Other Ambulatory Visit: Payer: Self-pay | Admitting: Unknown Physician Specialty

## 2016-12-28 ENCOUNTER — Other Ambulatory Visit: Payer: Self-pay | Admitting: Unknown Physician Specialty

## 2017-01-02 ENCOUNTER — Emergency Department
Admission: EM | Admit: 2017-01-02 | Discharge: 2017-01-02 | Disposition: A | Discharge status: Home / Self Care | Payer: Medicare Other | Attending: Emergency Medicine | Admitting: Emergency Medicine

## 2017-01-02 ENCOUNTER — Encounter: Payer: Self-pay | Admitting: Emergency Medicine

## 2017-01-02 ENCOUNTER — Other Ambulatory Visit: Payer: Self-pay

## 2017-01-02 ENCOUNTER — Emergency Department: Payer: Medicare Other

## 2017-01-02 DIAGNOSIS — Z79899 Other long term (current) drug therapy: Secondary | ICD-10-CM | POA: Diagnosis not present

## 2017-01-02 DIAGNOSIS — W08XXXA Fall from other furniture, initial encounter: Secondary | ICD-10-CM | POA: Insufficient documentation

## 2017-01-02 DIAGNOSIS — I10 Essential (primary) hypertension: Secondary | ICD-10-CM | POA: Insufficient documentation

## 2017-01-02 DIAGNOSIS — S92515B Nondisplaced fracture of proximal phalanx of left lesser toe(s), initial encounter for open fracture: Secondary | ICD-10-CM | POA: Diagnosis not present

## 2017-01-02 DIAGNOSIS — M79662 Pain in left lower leg: Secondary | ICD-10-CM | POA: Diagnosis not present

## 2017-01-02 DIAGNOSIS — S92502B Displaced unspecified fracture of left lesser toe(s), initial encounter for open fracture: Secondary | ICD-10-CM | POA: Diagnosis not present

## 2017-01-02 DIAGNOSIS — Y939 Activity, unspecified: Secondary | ICD-10-CM | POA: Diagnosis not present

## 2017-01-02 DIAGNOSIS — S8992XA Unspecified injury of left lower leg, initial encounter: Secondary | ICD-10-CM | POA: Diagnosis not present

## 2017-01-02 DIAGNOSIS — E039 Hypothyroidism, unspecified: Secondary | ICD-10-CM | POA: Insufficient documentation

## 2017-01-02 DIAGNOSIS — S92902B Unspecified fracture of left foot, initial encounter for open fracture: Secondary | ICD-10-CM

## 2017-01-02 DIAGNOSIS — S92512A Displaced fracture of proximal phalanx of left lesser toe(s), initial encounter for closed fracture: Secondary | ICD-10-CM | POA: Diagnosis not present

## 2017-01-02 DIAGNOSIS — Y929 Unspecified place or not applicable: Secondary | ICD-10-CM | POA: Diagnosis not present

## 2017-01-02 DIAGNOSIS — Y999 Unspecified external cause status: Secondary | ICD-10-CM | POA: Diagnosis not present

## 2017-01-02 DIAGNOSIS — S8991XA Unspecified injury of right lower leg, initial encounter: Secondary | ICD-10-CM | POA: Diagnosis not present

## 2017-01-02 MED ORDER — LIDOCAINE HCL (PF) 1 % IJ SOLN
INTRAMUSCULAR | Status: AC
  Administered 2017-01-02: 5 mL
  Filled 2017-01-02: qty 5

## 2017-01-02 MED ORDER — LIDOCAINE HCL 1 % IJ SOLN
5.0000 mL | Freq: Once | INTRAMUSCULAR | Status: AC
Start: 2017-01-02 — End: 2017-01-02
  Administered 2017-01-02: 5 mL
  Filled 2017-01-02: qty 5

## 2017-01-02 MED ORDER — CLINDAMYCIN HCL 300 MG PO CAPS: 300.0000 mg | ORAL_CAPSULE | Freq: Three times a day (TID) | ORAL | 0 refills | Status: AC

## 2017-01-02 NOTE — ED Notes (Signed)
Pt ambulatory to wheelchair upon discharge. Pt family insisted on wheeling pt out and declined assistance from this RN. Pt and family member verbalized understanding of discharge instruction, suture-removal follow-up and s/s of infection. Pt A&O x4. Skin warm and dry. Pt in NAD at this time.

## 2017-01-02 NOTE — ED Triage Notes (Signed)
Patient to ER for c/o pain to LLE from knee down after fall this evening. Patient states a stool fell out from under her. Denies hitting head. Denies LOC. Patient with abrasion to left shin. Patient reports 5th toe on left foot was bent outwards, had to push back in place. Patient with swelling and deformity to 5th toe. Bleeding controlled.

## 2017-01-02 NOTE — ED Notes (Signed)
Pt reports stepping onto a stool and the stool fell out from under her. Pt states she is unable to move her smallest two toes on her left foot. Swelling noticable to left ankle with small avulsions on the left toes. Also has some scratches to left tibia.

## 2017-01-02 NOTE — ED Provider Notes (Signed)
[facility] Emergency Department Provider Note  ____________________________________________  Time seen: Approximately 8:36 PM  I have reviewed the triage vital signs and the nursing notes.   HISTORY  Chief Complaint Fall and Leg Pain    HPI Alyssa BentonLinda Gaile Zuniga is a 51 y.o. female presents to the emergency department with 10 out of 10 left shank pain after patient reports that a stool fell out from underneath of her.  Patient reports that her left fifth toe was deviated at an unnatural angle and that she manually reduced toe.  She has not been able to bear weight since the incident.  She denies radiculopathy or changes in sensation of the lower extremities. Patient secondarily reports a laceration at the base of the left fifth toe.   Past Medical History:  Diagnosis Date  . Anxiety   . 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 Chronic pain    lupus, fibromyalgia  . Depression   . Fibromyalgia   . GERD (gastroesophageal reflux disease)   . Headache, common migraine   . Hypertension   . Hypothyroidism   . IBS (irritable bowel syndrome)   . Kidney stones   . Manic depression (HCC)   . Migraines    2-3x/wk  . Occasional tremors   . PTSD (post-traumatic stress disorder)     Patient Active Problem List   Diagnosis Date Noted  . Dizzy 07/13/2016  . Right knee pain 06/13/2016  . Multiple rib fractures 03/08/2016  . Dyspareunia in female 09/25/2015  . Headache disorder 06/24/2015  . Noninfectious diarrhea   . Heartburn   . Diarrhea   . Gastritis   . Arthritis, degenerative 05/28/2015  . Hypertension 04/21/2015  . Depression 04/21/2015  . Tremors of nervous system 01/14/2015  . Incontinence of bowel 01/14/2015  . Incontinence of urine 01/14/2015  . Fibromyalgia 12/15/2014  . Chronic pain 12/15/2014  . Vitamin D deficiency 12/15/2014  . Frozen shoulder 12/15/2014  . Hyperemesis 12/15/2014  . Hypothyroidism 11/05/2014  .  Diaphoresis 11/05/2014  . Memory loss 11/05/2014  . Hypercholesteremia 11/05/2014  . Migraines 11/05/2014  . Insomnia 11/05/2014  . Atypical chest pain 07/07/2014  . Cephalalgia 10/02/2013  . Anxiety 05/07/2013  . Carpal tunnel syndrome 02/03/2013  . Neuropathy 10/29/2012  . Acid reflux 10/29/2012  . Back ache 08/10/2012  . Benign hypertension 09/25/2008    Past Surgical History:  Procedure Laterality Date  . ABDOMINAL HYSTERECTOMY    . bunion removal Right   . CARDIAC CATHETERIZATION  07/05/14   [facility]  . CESAREAN SECTION    . COLONOSCOPY WITH PROPOFOL N/A 06/15/2015   Procedure: COLONOSCOPY WITH PROPOFOL;  Surgeon: Midge Miniumarren Wohl, MD;  [facility];  Service: Endoscopy;  Laterality: N/A;  . ESOPHAGOGASTRODUODENOSCOPY (EGD) WITH PROPOFOL N/A 06/15/2015   Procedure: ESOPHAGOGASTRODUODENOSCOPY (EGD) WITH PROPOFOL;  Surgeon: Midge Miniumarren Wohl, MD;  [facility];  Service: Endoscopy;  Laterality: N/A;    Prior to Admission medications   Medication Sig Start Date End Date Taking? Authorizing Provider  amitriptyline (ELAVIL) 100 MG tablet Take 100 mg by mouth daily. 10/10/16   [provider]  calcium carbonate (TUMS EX) 750 MG chewable tablet Chew 1 tablet by mouth daily.    [provider]  Chlorpheniramine Maleate (ALLERGY RELIEF PO) Take by mouth daily as needed.    [provider]  citalopram (CELEXA) 40 MG tablet Take 40 mg by mouth daily. 06/09/16   [provider]  clindamycin (CLEOCIN) 300 MG capsule Take 1 capsule (300 mg total) by mouth 3 (three) times daily for 10 days. 01/02/17 01/12/17  Pia Mau M, PA-C  Cyanocobalamin (VITAMIN B-12) 500 MCG LOZG Take by mouth as needed.     [provider]  estradiol (ESTRACE) 1 MG tablet TAKE 1 TABLET(1 MG) BY MOUTH DAILY 12/19/16   Gabriel Cirri, NP  fluticasone [facility]) 50 MCG/ACT nasal spray Place 2 sprays into both nostrils daily. 08/25/16   Lutricia Feil, PA-C   levothyroxine (SYNTHROID, LEVOTHROID) 112 MCG tablet Take 1 tablet (112 mcg total) by mouth daily before breakfast. 10/27/16   Gabriel Cirri, NP  lidocaine (LIDODERM) 5 % Place 3 patches onto the skin daily. Remove & Discard patch within 12 hours or as directed by MD 06/13/16   Gabriel Cirri, NP  LYRICA 150 MG capsule Take 150 mg by mouth 2 (two) times daily. 03/07/16   [provider]  Magnesium 250 MG TABS Take by mouth daily.    [provider]  Multiple Vitamins-Minerals (MULTI + OMEGA-3 ADULT GUMMIES PO) Take by mouth daily.    [provider]  Multiple Vitamins-Minerals (ZINC PO) Take by mouth.    [provider]  naproxen (NAPROSYN) 500 MG tablet TAKE 1 TABLET(500 MG) BY MOUTH TWICE DAILY 10/17/16   Gabriel Cirri, NP  ondansetron (ZOFRAN) 8 MG tablet Take by mouth every 8 (eight) hours as needed for nausea or vomiting.    [provider]  oxyCODONE (OXYCONTIN) 20 mg 12 hr tablet Take 20 mg by mouth every 12 (twelve) hours.    [provider]  Oxycodone HCl 10 MG TABS Take 10 mg by mouth daily.    [provider]  promethazine (PHENERGAN) 25 MG tablet TAKE 1 TABLET(25 MG) BY MOUTH EVERY 8 HOURS AS NEEDED FOR NAUSEA OR VOMITING 12/28/16   Gabriel Cirri, NP  spironolactone (ALDACTONE) 25 MG tablet Take 1 tablet (25 mg total) by mouth daily. 06/13/16   Gabriel Cirri, NP  SUMAtriptan (IMITREX) 100 MG tablet Take 1 tablet (100 mg total) by mouth every 2 (two) hours as needed for migraine. May repeat in 2 hours if headache persists or recurs. 06/13/16   Gabriel Cirri, NP  SUMAtriptan (IMITREX) 100 MG tablet TAKE 1 TABLET BY MOUTH FOR HEADACHE, MAY REPEAT AFTER 2 HOURS AS NEEDED 12/28/16   Gabriel Cirri, NP  topiramate (TOPAMAX) 50 MG tablet TAKE 1 TABLET(50 MG) BY MOUTH TWICE DAILY 12/16/16   Gabriel Cirri, NP  triamterene-hydrochlorothiazide (DYAZIDE) 37.5-25 MG capsule Take 1 each (1 capsule total) by mouth daily. 09/06/16   Gabriel Cirri, NP  Turmeric Curcumin 500 MG CAPS Take 500 mg by mouth daily.    [provider]    Allergies Sulfa antibiotics; Sulfasalazine; Acetaminophen; Penicillins; and Sulfacetamide sodium  Family History  Problem Relation Age of Onset  . Depression Mother   . Anxiety disorder Mother   . Cancer Mother        breast  . Migraines Mother   . Stroke Father   . Heart disease Father        MI  . Alcohol abuse Father   . Arthritis Father   . Migraines Father   . Thyroid disease Father   . Thyroid disease Maternal Grandmother   . Hyperlipidemia Maternal Grandmother   . Migraines Brother   . Anxiety disorder Brother   . Heart disease Maternal Grandfather        MI  . Hyperlipidemia Paternal  Grandmother   . Thyroid disease Paternal Grandmother   . Heart disease Paternal Grandfather        MI  . Migraines Daughter   . Migraines Son   . Migraines Son   . Migraines Daughter     Social History Social History   Tobacco Use  . Smoking status: Never Smoker  . Smokeless tobacco: Never Used  Substance Use Topics  . Alcohol use: Yes    Alcohol/week: 0.0 oz    Comment: rare  . Drug use: No     Review of Systems  Constitutional: No fever/chills Eyes: No visual changes. No discharge ENT: No upper respiratory complaints. Cardiovascular: no chest pain. Respiratory: no cough. No SOB. Gastrointestinal: No abdominal pain.  No nausea, no vomiting.  No diarrhea.  No constipation. Musculoskeletal: Patient has left foot pain.  Skin: Patient has a laceration. Neurological: Negative for headaches, focal weakness or numbness.   ____________________________________________   PHYSICAL EXAM:  VITAL SIGNS: ED Triage Vitals  Enc Vitals Group     BP 01/02/17 2019 (!) 162/104     Pulse Rate 01/02/17 2019 89     Resp 01/02/17 2019 18     Temp 01/02/17 2019 (!) 97.4 F (36.3 C)     Temp Source 01/02/17 2019 Oral     SpO2 01/02/17 2019 98 %     Weight 01/02/17 2019 135 lb  (61.2 kg)     Height 01/02/17 2019 5\' 2"  (1.575 m)     Head Circumference --      Peak Flow --      Pain Score 01/02/17 2018 10     Pain Loc --      Pain Edu? --      Excl. in GC? --      Constitutional: Alert and oriented. Well appearing and in no acute distress. Eyes: Conjunctivae are normal. PERRL. EOMI. Head: Atraumatic. Cardiovascular: Normal rate, regular rhythm. Normal S1 and S2.  Good peripheral circulation. Respiratory: Normal respiratory effort without tachypnea or retractions. Lungs CTAB. Good air entry to the bases with no decreased or absent breath sounds. Musculoskeletal: Patient is able to perform full range of motion at the left ankle.  Patient has tenderness to palpation over the fourth and fifth left toes..  No tenderness was elicited with palpation over the course of the fibula.  She is able to move all 5 left toes.  Palpable dorsalis pedis pulse bilaterally and symmetrically. Neurologic:  Normal speech and language. No gross focal neurologic deficits are appreciated.  Skin:  Skin is warm, dry and intact. No rash noted. Psychiatric: Mood and affect are normal. Speech and behavior are normal. Patient exhibits appropriate insight and judgement.   ____________________________________________   LABS (all labs ordered are listed, but only abnormal results are displayed)  Labs Reviewed - No data to display ____________________________________________  EKG   ____________________________________________  RADIOLOGY Geraldo PitterI, Khadijatou Borak M Vernel Donlan, personally viewed and evaluated these images (plain radiographs) as part of my medical decision making, as well as reviewing the written report by the radiologist.    Dg Tibia/fibula Left  Result Date: 01/02/2017 CLINICAL DATA:  Left lower extremity pain after a fall this evening. EXAM: LEFT TIBIA AND FIBULA - 2 VIEW COMPARISON:  None. FINDINGS: There is no evidence of fracture or other focal bone lesions. Soft tissues are  unremarkable. IMPRESSION: Negative. Electronically Signed   By: Kennith CenterEric  Mansell M.D.   On: 01/02/2017 20:50   Dg Foot Complete Left  Result Date: 01/02/2017  CLINICAL DATA:  Pain after fall from stool today. EXAM: LEFT FOOT - COMPLETE 3+ VIEW COMPARISON:  None. FINDINGS: Transverse fracture identified through the proximal aspect of the little toe proximal phalanx. There is also an associated corner fracture involving the base of the fourth toe middle phalanx (which is fused to the distal phalanx). Mild hallux valgus deformity noted with mild degenerative changes at the MTP joint of the great toe. IMPRESSION: 1. Transverse fracture through the proximal aspect of the little toe proximal phalanx. 2. Corner fracture through the base of the fourth toe middle phalanx. Electronically Signed   By: Kennith Center M.D.   On: 01/02/2017 20:53    ____________________________________________    PROCEDURES  Procedure(s) performed:    Procedures  LACERATION REPAIR Performed by: Orvil Feil Authorized by: Orvil Feil Consent: Verbal consent obtained. Risks and benefits: risks, benefits and alternatives were discussed Consent given by: patient Patient identity confirmed: provided demographic data Prepped and Draped in normal sterile fashion Wound explored  Laceration Location: Base of left 5th toe  Laceration Length: 2 cm  No Foreign Bodies seen or palpated  Anesthesia: local infiltration  Local anesthetic: lidocaine 1% without epinephrine  Anesthetic total: 4 ml  Irrigation method: syringe Amount of cleaning: standard  Skin closure: 4-0 Ethilon   Number of sutures: 5  Technique: Simple Interrupted  Patient tolerance: Patient tolerated the procedure well with no immediate complications.   Medications  lidocaine (XYLOCAINE) 1 % (with pres) injection 5 mL (not administered)     ____________________________________________   INITIAL IMPRESSION / ASSESSMENT AND PLAN / ED  COURSE  Pertinent labs & imaging results that were available during my care of the patient were reviewed by me and considered in my medical decision making (see chart for details).  Review of the  [place] was performed in accordance of the [place] prior to dispensing any controlled drugs.     Assessment and Plan: Left foot fracture Laceration Patient presents to the emergency department with left fourth and fifth toe pain after patient reports that she fell from a bar stool earlier this evening.  X-ray examination reveals phalanx fractures of the fourth and fifth left toes.  Patient also sustained a 2 cm laceration at the base of the left fifth digit.  Patient underwent laceration repair without complication and was discharged with clindamycin as fifth toe fracture is open.  Patient has penicillin and sulfa allergies.  The patient was referred to podiatry and advised to have sutures removed by primary care in 10 days.  ____________________________________________  FINAL CLINICAL IMPRESSION(S) / ED DIAGNOSES  Final diagnoses:  Open fracture of left foot, initial encounter      NEW MEDICATIONS STARTED DURING THIS VISIT:  ED Discharge Orders        Ordered    clindamycin (CLEOCIN) 300 MG capsule  3 times daily     01/02/17 2135          This chart was dictated using voice recognition software/Dragon. Despite best efforts to proofread, errors can occur which can change the meaning. Any change was purely unintentional.    Orvil Feil, PA-C 01/02/17 2146    Merrily Brittle, MD 01/02/17 [phone]

## 2017-01-02 NOTE — ED Notes (Signed)
Portable XR at bedside

## 2017-01-09 DIAGNOSIS — Y33XXXA Other specified events, undetermined intent, initial encounter: Secondary | ICD-10-CM | POA: Diagnosis not present

## 2017-01-09 DIAGNOSIS — X58XXXD Exposure to other specified factors, subsequent encounter: Secondary | ICD-10-CM | POA: Diagnosis not present

## 2017-01-09 DIAGNOSIS — M79676 Pain in unspecified toe(s): Secondary | ICD-10-CM | POA: Diagnosis not present

## 2017-01-09 DIAGNOSIS — S92515D Nondisplaced fracture of proximal phalanx of left lesser toe(s), subsequent encounter for fracture with routine healing: Secondary | ICD-10-CM | POA: Diagnosis not present

## 2017-01-09 DIAGNOSIS — S92515A Nondisplaced fracture of proximal phalanx of left lesser toe(s), initial encounter for closed fracture: Secondary | ICD-10-CM | POA: Diagnosis not present

## 2017-01-09 DIAGNOSIS — S91115D Laceration without foreign body of left lesser toe(s) without damage to nail, subsequent encounter: Secondary | ICD-10-CM | POA: Diagnosis not present

## 2017-01-09 DIAGNOSIS — X58XXXA Exposure to other specified factors, initial encounter: Secondary | ICD-10-CM | POA: Diagnosis not present

## 2017-01-09 DIAGNOSIS — I10 Essential (primary) hypertension: Secondary | ICD-10-CM | POA: Diagnosis not present

## 2017-01-09 DIAGNOSIS — M79675 Pain in left toe(s): Secondary | ICD-10-CM | POA: Diagnosis not present

## 2017-01-11 DIAGNOSIS — M545 Low back pain: Secondary | ICD-10-CM | POA: Diagnosis not present

## 2017-01-11 DIAGNOSIS — M47816 Spondylosis without myelopathy or radiculopathy, lumbar region: Secondary | ICD-10-CM | POA: Diagnosis not present

## 2017-01-11 DIAGNOSIS — Z79891 Long term (current) use of opiate analgesic: Secondary | ICD-10-CM | POA: Diagnosis not present

## 2017-01-11 DIAGNOSIS — M25512 Pain in left shoulder: Secondary | ICD-10-CM | POA: Diagnosis not present

## 2017-01-11 DIAGNOSIS — G894 Chronic pain syndrome: Secondary | ICD-10-CM | POA: Diagnosis not present

## 2017-01-17 ENCOUNTER — Other Ambulatory Visit: Payer: Self-pay | Admitting: Unknown Physician Specialty

## 2017-01-17 MED ORDER — AMITRIPTYLINE HCL 100 MG PO TABS: 100.0000 mg | ORAL_TABLET | Freq: Every day | ORAL | 1 refills | Status: DC

## 2017-01-17 MED ORDER — LIDOCAINE 5 % EX PTCH: 3.0000 | MEDICATED_PATCH | CUTANEOUS | 0 refills | Status: AC

## 2017-01-17 MED ORDER — PROMETHAZINE HCL 25 MG PO TABS: 25.0000 mg | ORAL_TABLET | Freq: Four times a day (QID) | ORAL | 2 refills | Status: AC | PRN

## 2017-01-17 MED ORDER — SUMATRIPTAN SUCCINATE 100 MG PO TABS: 100.0000 mg | ORAL_TABLET | ORAL | 0 refills | Status: DC | PRN

## 2017-01-17 NOTE — Telephone Encounter (Signed)
[facility] sent request for the patients following..[facility]  Paper was placed in Cheryls basket also  Thanks  Amitriptyline 100mg  Lidocaine patches prometrazine 25mg   Sumatriptan 100mg 

## 2017-01-20 DIAGNOSIS — S92515A Nondisplaced fracture of proximal phalanx of left lesser toe(s), initial encounter for closed fracture: Secondary | ICD-10-CM | POA: Diagnosis not present

## 2017-01-20 DIAGNOSIS — M79672 Pain in left foot: Secondary | ICD-10-CM | POA: Diagnosis not present

## 2017-02-26 DIAGNOSIS — G43909 Migraine, unspecified, not intractable, without status migrainosus: Secondary | ICD-10-CM | POA: Diagnosis not present

## 2017-02-26 DIAGNOSIS — I10 Essential (primary) hypertension: Secondary | ICD-10-CM | POA: Diagnosis not present

## 2017-02-26 DIAGNOSIS — M797 Fibromyalgia: Secondary | ICD-10-CM | POA: Diagnosis not present

## 2017-02-26 DIAGNOSIS — R42 Dizziness and giddiness: Secondary | ICD-10-CM | POA: Diagnosis not present

## 2017-02-26 DIAGNOSIS — H8110 Benign paroxysmal vertigo, unspecified ear: Secondary | ICD-10-CM | POA: Diagnosis not present

## 2017-02-26 DIAGNOSIS — R262 Difficulty in walking, not elsewhere classified: Secondary | ICD-10-CM | POA: Diagnosis not present

## 2017-02-26 DIAGNOSIS — R079 Chest pain, unspecified: Secondary | ICD-10-CM | POA: Diagnosis not present

## 2017-02-26 DIAGNOSIS — R404 Transient alteration of awareness: Secondary | ICD-10-CM | POA: Diagnosis not present

## 2017-02-27 DIAGNOSIS — R079 Chest pain, unspecified: Secondary | ICD-10-CM | POA: Diagnosis not present

## 2017-02-27 DIAGNOSIS — M549 Dorsalgia, unspecified: Secondary | ICD-10-CM | POA: Diagnosis not present

## 2017-02-27 DIAGNOSIS — R262 Difficulty in walking, not elsewhere classified: Secondary | ICD-10-CM | POA: Diagnosis not present

## 2017-02-27 DIAGNOSIS — D751 Secondary polycythemia: Secondary | ICD-10-CM | POA: Diagnosis not present

## 2017-02-27 DIAGNOSIS — Z882 Allergy status to sulfonamides status: Secondary | ICD-10-CM | POA: Diagnosis not present

## 2017-02-27 DIAGNOSIS — H8110 Benign paroxysmal vertigo, unspecified ear: Secondary | ICD-10-CM | POA: Diagnosis not present

## 2017-02-27 DIAGNOSIS — R404 Transient alteration of awareness: Secondary | ICD-10-CM | POA: Diagnosis not present

## 2017-02-27 DIAGNOSIS — M25559 Pain in unspecified hip: Secondary | ICD-10-CM | POA: Diagnosis not present

## 2017-02-27 DIAGNOSIS — Z880 Allergy status to penicillin: Secondary | ICD-10-CM | POA: Diagnosis not present

## 2017-02-27 DIAGNOSIS — G8929 Other chronic pain: Secondary | ICD-10-CM | POA: Diagnosis not present

## 2017-02-27 DIAGNOSIS — I10 Essential (primary) hypertension: Secondary | ICD-10-CM | POA: Diagnosis not present

## 2017-02-27 DIAGNOSIS — Z823 Family history of stroke: Secondary | ICD-10-CM | POA: Diagnosis not present

## 2017-02-27 DIAGNOSIS — D696 Thrombocytopenia, unspecified: Secondary | ICD-10-CM | POA: Diagnosis not present

## 2017-02-27 DIAGNOSIS — R42 Dizziness and giddiness: Secondary | ICD-10-CM | POA: Diagnosis not present

## 2017-02-27 DIAGNOSIS — R292 Abnormal reflex: Secondary | ICD-10-CM | POA: Diagnosis not present

## 2017-02-27 DIAGNOSIS — M797 Fibromyalgia: Secondary | ICD-10-CM | POA: Diagnosis not present

## 2017-02-27 DIAGNOSIS — R51 Headache: Secondary | ICD-10-CM | POA: Diagnosis not present

## 2017-02-27 DIAGNOSIS — E876 Hypokalemia: Secondary | ICD-10-CM | POA: Diagnosis not present

## 2017-02-27 DIAGNOSIS — G43909 Migraine, unspecified, not intractable, without status migrainosus: Secondary | ICD-10-CM | POA: Diagnosis not present

## 2017-02-28 DIAGNOSIS — R42 Dizziness and giddiness: Secondary | ICD-10-CM | POA: Diagnosis not present

## 2017-02-28 DIAGNOSIS — I10 Essential (primary) hypertension: Secondary | ICD-10-CM | POA: Diagnosis not present

## 2017-02-28 DIAGNOSIS — R51 Headache: Secondary | ICD-10-CM | POA: Diagnosis not present

## 2017-02-28 DIAGNOSIS — R292 Abnormal reflex: Secondary | ICD-10-CM | POA: Diagnosis not present

## 2017-03-01 DIAGNOSIS — R51 Headache: Secondary | ICD-10-CM | POA: Diagnosis not present

## 2017-03-01 DIAGNOSIS — I10 Essential (primary) hypertension: Secondary | ICD-10-CM | POA: Diagnosis not present

## 2017-03-01 DIAGNOSIS — G43909 Migraine, unspecified, not intractable, without status migrainosus: Secondary | ICD-10-CM | POA: Diagnosis not present

## 2017-03-01 DIAGNOSIS — R42 Dizziness and giddiness: Secondary | ICD-10-CM | POA: Diagnosis not present

## 2017-03-01 DIAGNOSIS — M797 Fibromyalgia: Secondary | ICD-10-CM | POA: Diagnosis not present

## 2017-03-01 DIAGNOSIS — H8110 Benign paroxysmal vertigo, unspecified ear: Secondary | ICD-10-CM | POA: Diagnosis not present

## 2017-03-02 DIAGNOSIS — G894 Chronic pain syndrome: Secondary | ICD-10-CM | POA: Diagnosis not present

## 2017-03-02 DIAGNOSIS — M545 Low back pain: Secondary | ICD-10-CM | POA: Diagnosis not present

## 2017-03-02 DIAGNOSIS — M47816 Spondylosis without myelopathy or radiculopathy, lumbar region: Secondary | ICD-10-CM | POA: Diagnosis not present

## 2017-03-02 DIAGNOSIS — M25512 Pain in left shoulder: Secondary | ICD-10-CM | POA: Diagnosis not present

## 2017-03-15 ENCOUNTER — Inpatient Hospital Stay: Payer: BLUE CROSS/BLUE SHIELD | Admitting: Unknown Physician Specialty

## 2017-03-24 ENCOUNTER — Encounter: Payer: Self-pay | Admitting: Unknown Physician Specialty

## 2017-03-24 ENCOUNTER — Other Ambulatory Visit: Payer: Self-pay | Admitting: Unknown Physician Specialty

## 2017-04-10 ENCOUNTER — Other Ambulatory Visit: Payer: Self-pay | Admitting: Unknown Physician Specialty

## 2017-04-15 DIAGNOSIS — R350 Frequency of micturition: Secondary | ICD-10-CM | POA: Diagnosis not present

## 2017-04-15 DIAGNOSIS — R609 Edema, unspecified: Secondary | ICD-10-CM | POA: Diagnosis not present

## 2017-04-15 DIAGNOSIS — M79671 Pain in right foot: Secondary | ICD-10-CM | POA: Diagnosis not present

## 2017-04-19 DIAGNOSIS — M9903 Segmental and somatic dysfunction of lumbar region: Secondary | ICD-10-CM | POA: Diagnosis not present

## 2017-04-19 DIAGNOSIS — M545 Low back pain: Secondary | ICD-10-CM | POA: Diagnosis not present

## 2017-05-01 ENCOUNTER — Ambulatory Visit: Payer: BLUE CROSS/BLUE SHIELD | Admitting: Unknown Physician Specialty

## 2017-05-05 DIAGNOSIS — Z79891 Long term (current) use of opiate analgesic: Secondary | ICD-10-CM | POA: Diagnosis not present

## 2017-05-05 DIAGNOSIS — M47816 Spondylosis without myelopathy or radiculopathy, lumbar region: Secondary | ICD-10-CM | POA: Diagnosis not present

## 2017-05-05 DIAGNOSIS — M25512 Pain in left shoulder: Secondary | ICD-10-CM | POA: Diagnosis not present

## 2017-05-05 DIAGNOSIS — G894 Chronic pain syndrome: Secondary | ICD-10-CM | POA: Diagnosis not present

## 2017-05-05 DIAGNOSIS — M545 Low back pain: Secondary | ICD-10-CM | POA: Diagnosis not present

## 2017-06-08 ENCOUNTER — Other Ambulatory Visit: Payer: Self-pay | Admitting: Unknown Physician Specialty

## 2017-06-23 DIAGNOSIS — M545 Low back pain: Secondary | ICD-10-CM | POA: Diagnosis not present

## 2017-06-23 DIAGNOSIS — G894 Chronic pain syndrome: Secondary | ICD-10-CM | POA: Diagnosis not present

## 2017-06-23 DIAGNOSIS — M47816 Spondylosis without myelopathy or radiculopathy, lumbar region: Secondary | ICD-10-CM | POA: Diagnosis not present

## 2017-06-23 DIAGNOSIS — M25512 Pain in left shoulder: Secondary | ICD-10-CM | POA: Diagnosis not present

## 2017-07-11 DIAGNOSIS — M9901 Segmental and somatic dysfunction of cervical region: Secondary | ICD-10-CM | POA: Diagnosis not present

## 2017-07-11 DIAGNOSIS — M9903 Segmental and somatic dysfunction of lumbar region: Secondary | ICD-10-CM | POA: Diagnosis not present

## 2017-07-11 DIAGNOSIS — M5136 Other intervertebral disc degeneration, lumbar region: Secondary | ICD-10-CM | POA: Diagnosis not present

## 2017-07-11 DIAGNOSIS — M50322 Other cervical disc degeneration at C5-C6 level: Secondary | ICD-10-CM | POA: Diagnosis not present

## 2017-07-14 DIAGNOSIS — Z79899 Other long term (current) drug therapy: Secondary | ICD-10-CM | POA: Diagnosis not present

## 2017-07-14 DIAGNOSIS — E079 Disorder of thyroid, unspecified: Secondary | ICD-10-CM | POA: Diagnosis not present

## 2017-07-14 DIAGNOSIS — M549 Dorsalgia, unspecified: Secondary | ICD-10-CM | POA: Diagnosis not present

## 2017-07-14 DIAGNOSIS — M1990 Unspecified osteoarthritis, unspecified site: Secondary | ICD-10-CM | POA: Diagnosis not present

## 2017-07-14 DIAGNOSIS — R159 Full incontinence of feces: Secondary | ICD-10-CM | POA: Diagnosis not present

## 2017-07-14 DIAGNOSIS — M542 Cervicalgia: Secondary | ICD-10-CM | POA: Diagnosis not present

## 2017-07-14 DIAGNOSIS — G8929 Other chronic pain: Secondary | ICD-10-CM | POA: Diagnosis not present

## 2017-07-14 DIAGNOSIS — M545 Low back pain: Secondary | ICD-10-CM | POA: Diagnosis not present

## 2017-07-14 DIAGNOSIS — R32 Unspecified urinary incontinence: Secondary | ICD-10-CM | POA: Diagnosis not present

## 2017-07-14 DIAGNOSIS — M47816 Spondylosis without myelopathy or radiculopathy, lumbar region: Secondary | ICD-10-CM | POA: Diagnosis not present

## 2017-07-14 DIAGNOSIS — I10 Essential (primary) hypertension: Secondary | ICD-10-CM | POA: Diagnosis not present

## 2017-07-14 IMAGING — CT CT RENAL STONE PROTOCOL
3 of 4 series · 8 of 46 positions shown, 15 images · non-contrast
Comparison: Multiple exams, including 02/16/2010

CLINICAL DATA: Right flank pain and hematuria starting last night.

EXAM:
CT ABDOMEN AND PELVIS WITHOUT CONTRAST
TECHNIQUE: Multidetector CT imaging of the abdomen and pelvis was performed
following the standard protocol without IV contrast.

[Series 4: lung · axial · 0.71mm/px · z∈[-141,-76]mm · 4 of 23 slices shown, 9 images]
[im 5/23  soft-tissue]
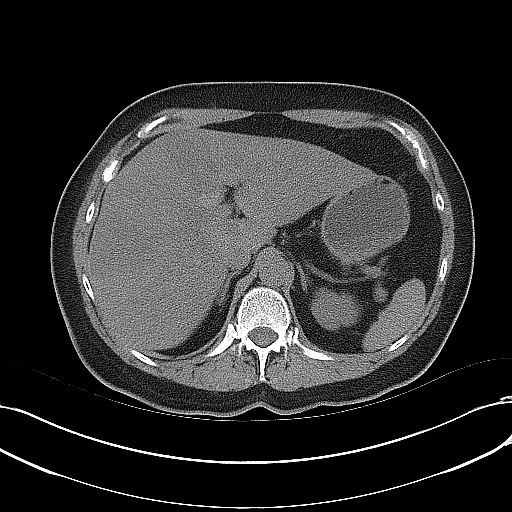
[im 5/23  lung]
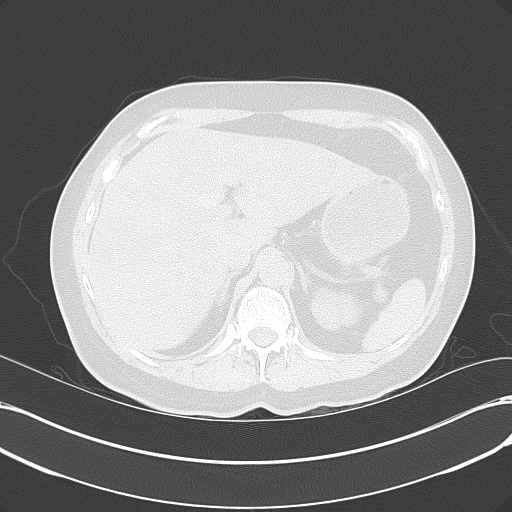
[im 5/23  bone]
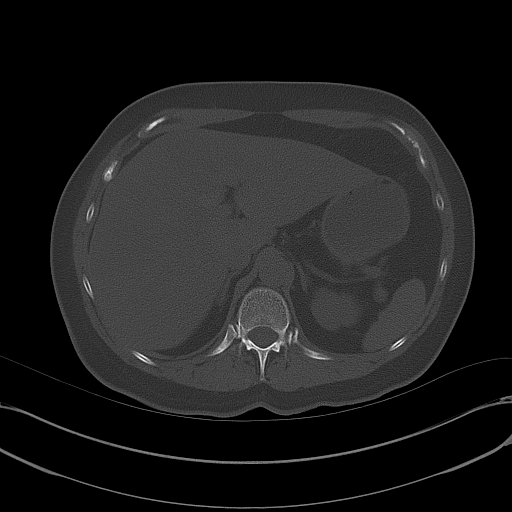
[im 9/23  soft-tissue]
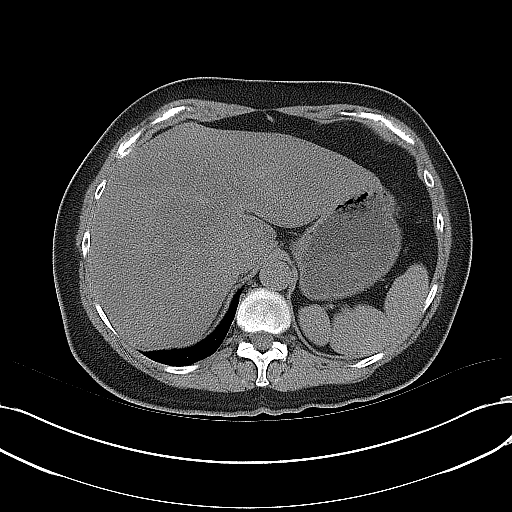
[im 9/23  lung]
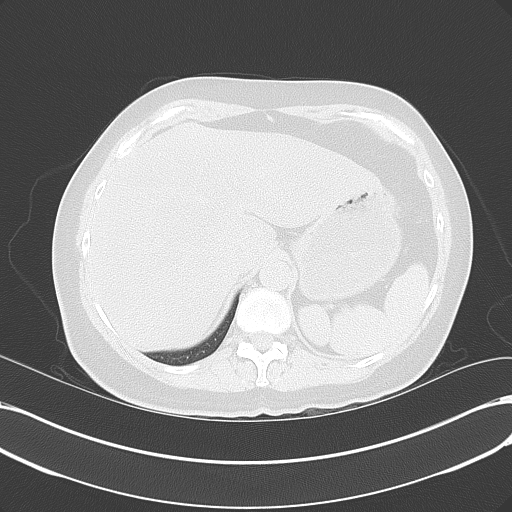
[im 14/23  soft-tissue]
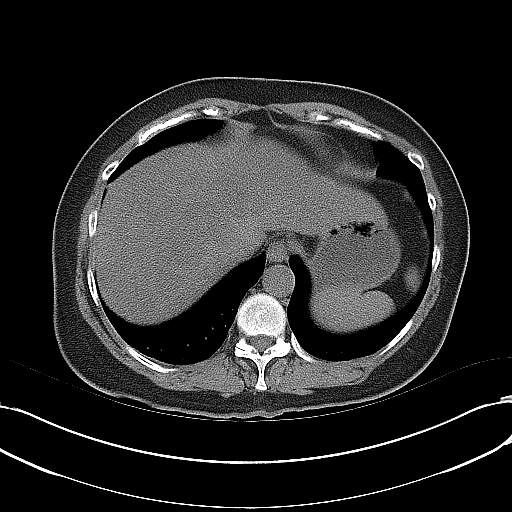
[im 14/23  lung]
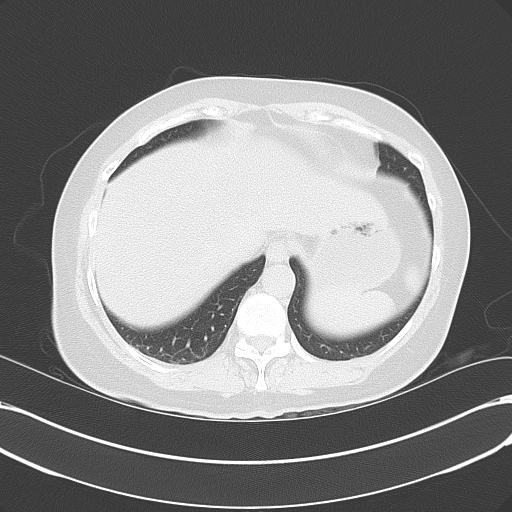
[im 18/23  soft-tissue]
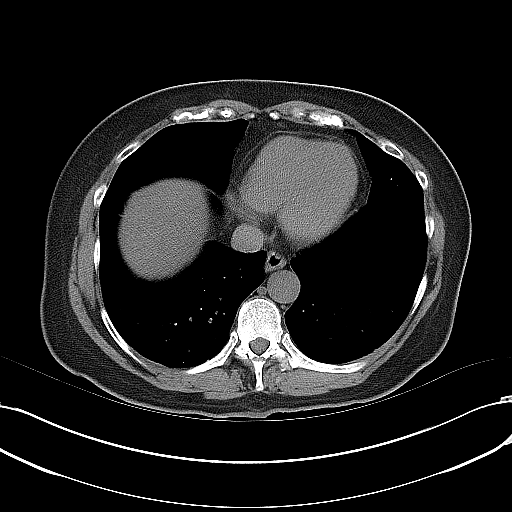
[im 18/23  lung]
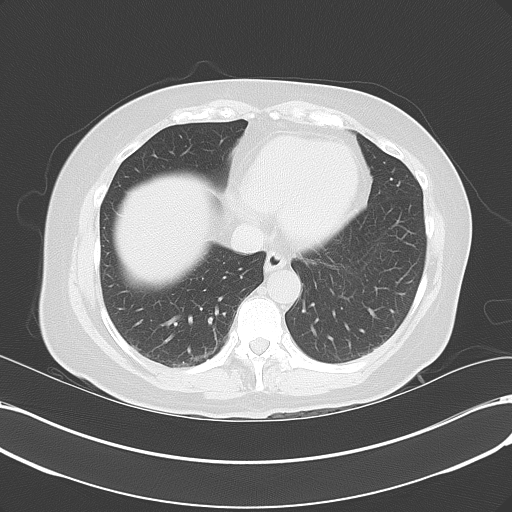

[Series 5: coronal · coronal · 0.71mm/px · 3 of 117 slices shown, 4 images]
[im 39/117  soft-tissue]
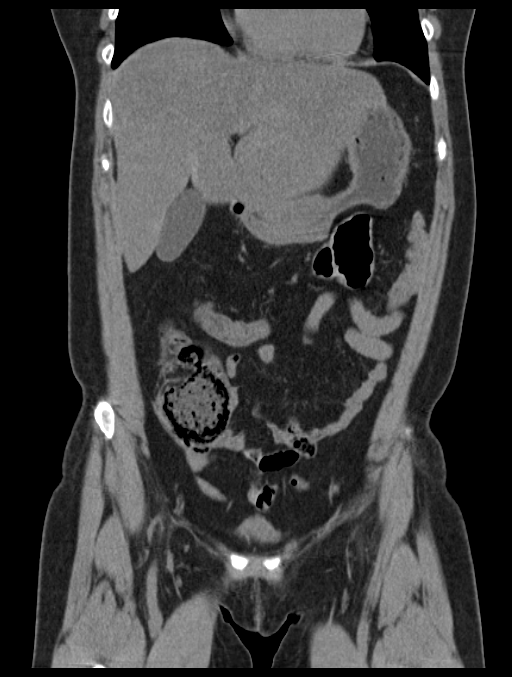
[im 52/117  soft-tissue]
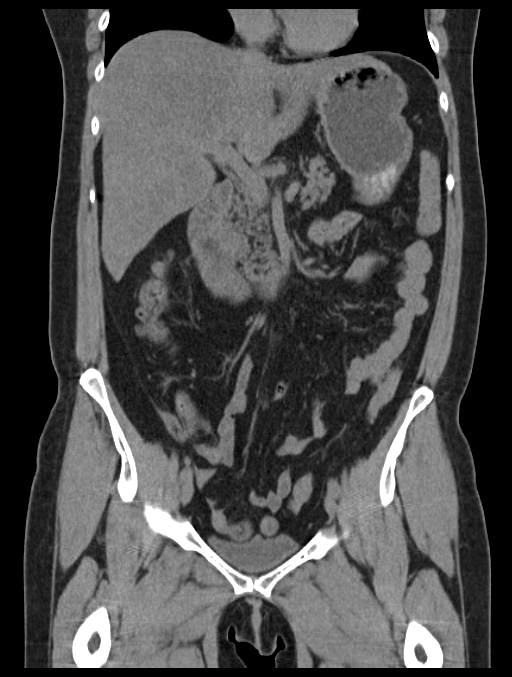
[im 52/117  bone]
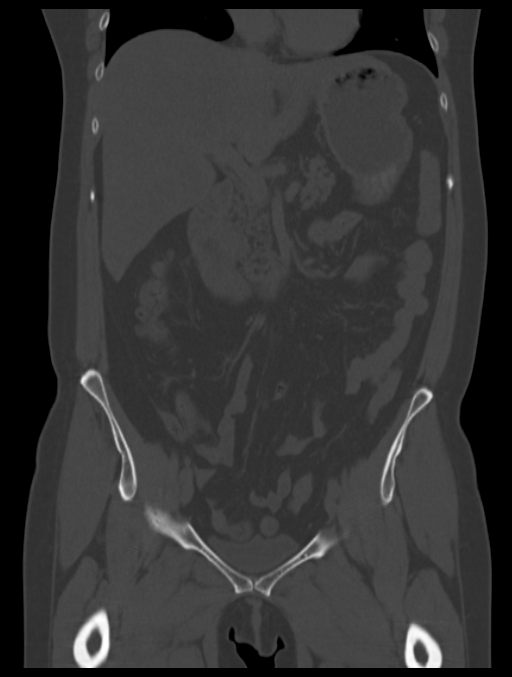
[im 65/117  soft-tissue]
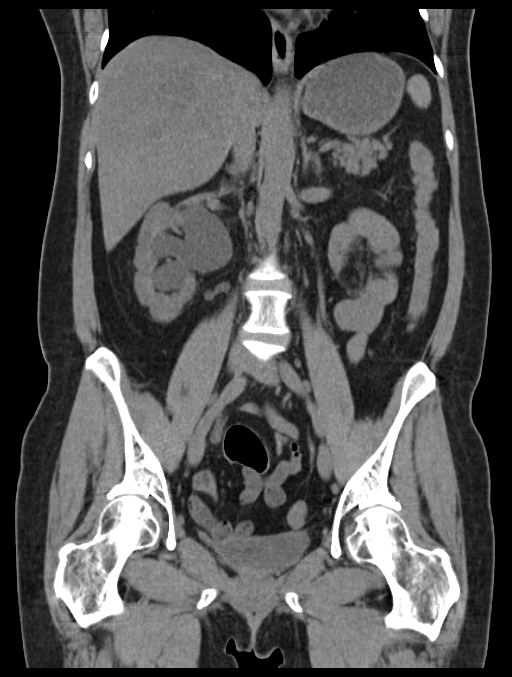

[Series 6: sagittal · sagittal · 0.54mm/px · 1 of 154 slices shown, 2 images]
[im 52/154  soft-tissue]
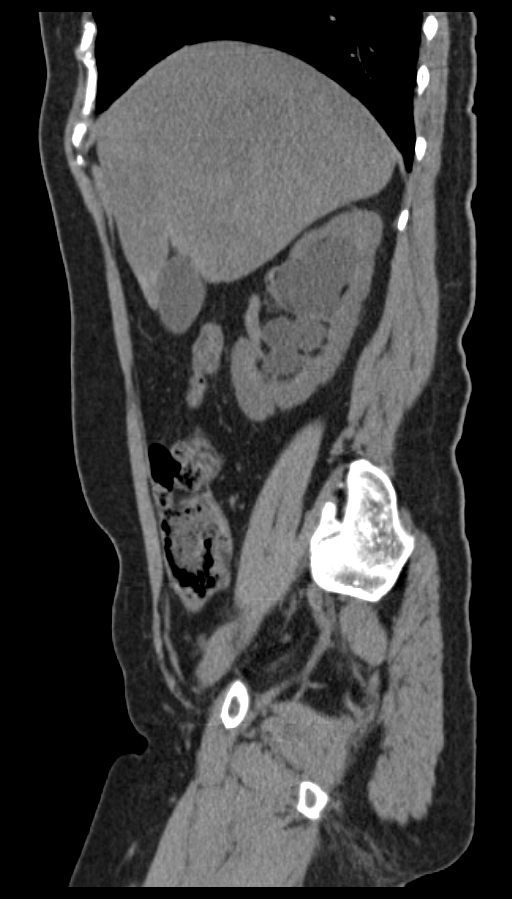
[im 52/154  bone]
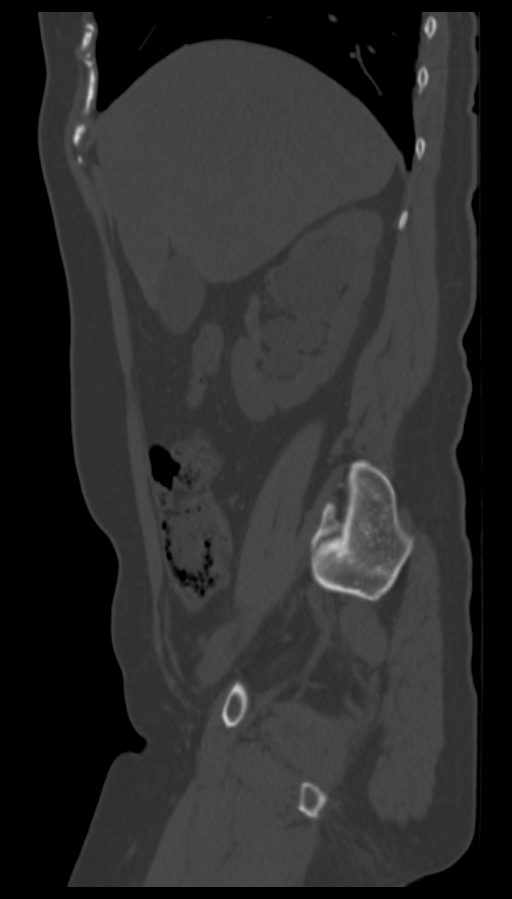

[8 of 46 positions shown; findings below may reference images not displayed]

FINDINGS: Lower chest:  Unremarkable

Hepatobiliary: Geographic but mostly diffuse hepatic steatosis.
Gallbladder unremarkable.

Pancreas: Unremarkable

Spleen: Unremarkable

Adrenals/Urinary Tract: Prominent right hydronephrosis and moderate
to prominent right hydroureter extending down to the right UVJ were
there is an indistinct 3 mm UVJ calculus. No other calculi
identified. Mild retained fetal lobulation of the kidneys. Adrenal
glands normal.

Stomach/Bowel: Unremarkable. Appendix not well seen but no right
lower quadrant inflammatory process characteristic for appendicitis
is observed.

Vascular/Lymphatic: Unremarkable

Reproductive: Uterus absent.  Ovaries not well seen.

Other: No supplemental non-categorized findings.

Musculoskeletal: Degenerative disc disease at L4-5 without
impingement.
IMPRESSION: 1. Considerable right hydronephrosis and moderate right hydroureter
due to a indistinct 3 mm right UVJ calculus. No other calculi
identified.
2. Geographic mostly diffuse hepatic steatosis.

## 2017-07-18 ENCOUNTER — Other Ambulatory Visit: Payer: Self-pay | Admitting: Unknown Physician Specialty

## 2017-07-18 ENCOUNTER — Other Ambulatory Visit: Payer: Self-pay | Admitting: Family Medicine

## 2017-07-20 NOTE — Telephone Encounter (Signed)
Needs appointment

## 2017-07-20 NOTE — Telephone Encounter (Signed)
topiramate refill Last Refill:03/24/17 # 180 No RF Last OV: 10/26/16 PCP: Gabriel Cirriheryl Wicker NP Pharmacy:[facility]

## 2017-07-20 NOTE — Telephone Encounter (Signed)
Inter face request refill for Estradol    LOV:  ( various cancellations)   Last refill3/11/19   Pharnmacy :  [facility]

## 2017-07-24 ENCOUNTER — Encounter: Payer: Self-pay | Admitting: Unknown Physician Specialty

## 2017-07-24 NOTE — Telephone Encounter (Signed)
Called pt , no answer, printed letter to mail for pt to fu with pcp for refills.

## 2017-08-09 ENCOUNTER — Encounter: Payer: Self-pay | Admitting: Unknown Physician Specialty

## 2017-08-09 ENCOUNTER — Ambulatory Visit (INDEPENDENT_AMBULATORY_CARE_PROVIDER_SITE_OTHER): Payer: BLUE CROSS/BLUE SHIELD | Admitting: Unknown Physician Specialty

## 2017-08-09 VITALS — BP 132/89 | HR 76 | Temp 98.7°F | Wt 141.2 lb

## 2017-08-09 DIAGNOSIS — E039 Hypothyroidism, unspecified: Secondary | ICD-10-CM | POA: Diagnosis not present

## 2017-08-09 DIAGNOSIS — I10 Essential (primary) hypertension: Secondary | ICD-10-CM | POA: Diagnosis not present

## 2017-08-09 DIAGNOSIS — M797 Fibromyalgia: Secondary | ICD-10-CM | POA: Diagnosis not present

## 2017-08-09 DIAGNOSIS — R296 Repeated falls: Secondary | ICD-10-CM | POA: Diagnosis not present

## 2017-08-09 DIAGNOSIS — I889 Nonspecific lymphadenitis, unspecified: Secondary | ICD-10-CM | POA: Diagnosis not present

## 2017-08-09 MED ORDER — DOXYCYCLINE HYCLATE 100 MG PO TABS: 100.0000 mg | ORAL_TABLET | Freq: Two times a day (BID) | ORAL | 0 refills | Status: DC

## 2017-08-09 NOTE — Assessment & Plan Note (Signed)
Stable, continue present medications.  Check labs today

## 2017-08-09 NOTE — Progress Notes (Signed)
BP 132/89   Pulse 76   Temp 98.7 F (37.1 C) (Oral)   Wt 141 lb 3.2 oz (64 kg)   LMP  (LMP Unknown)   SpO2 97%   BMI 25.83 kg/m    Subjective:    Patient ID: Alyssa Zuniga, female    DOB: 1965-08-05, 52 y.o.   MRN: [id number]  HPI: Alyssa Zuniga is a 52 y.o. female  Chief Complaint  Patient presents with  . Cyst    pt states she felt a knot under her chin on the right side a few nights ago. States her neck has been really sore, then the knot popped up   Pt with multiple chronic medical conditions.  Since last seen multiple falls in which she sprung ankle twice, got stitches in toe, and bruised hip and face.  Seen in the ER for these issues.  She had some problem with her neurologist at [facility] and lost to f/u but feels neurological issues are part of her problem.    Lymph nodes Pt with 3 days of sore neck.  Noted 2 tender lumps right side of neck.     Hypertension Lost to f/u for hypertension Using medications without difficulty Average home BPs not chekcing   No problems or lightheadedness No chest pain with exertion or shortness of breath No Edema  Hypothyrroid Lost to f/u but taking thyroid medication.    Arthritis ? Lupus.  Has seen a rheumatologist.  She would like Prednisone   Relevant past medical, surgical, family and social history reviewed and updated as indicated. Interim medical history since our last visit reviewed. Allergies and medications reviewed and updated.  Review of Systems  Per HPI unless specifically indicated above     Objective:    BP 132/89   Pulse 76   Temp 98.7 F (37.1 C) (Oral)   Wt 141 lb 3.2 oz (64 kg)   LMP  (LMP Unknown)   SpO2 97%   BMI 25.83 kg/m   Wt Readings from Last 3 Encounters:  08/09/17 141 lb 3.2 oz (64 kg)  01/02/17 135 lb (61.2 kg)  12/03/16 135 lb (61.2 kg)    Physical Exam  Constitutional: She is oriented to person, place, and time. She appears well-developed and well-nourished.  HENT:  Head:  Normocephalic and atraumatic.  Eyes: Pupils are equal, round, and reactive to light. Right eye exhibits no discharge. Left eye exhibits no discharge. No scleral icterus.  Neck: Normal range of motion. Neck supple. Carotid bruit is not present. No thyromegaly present.  Cardiovascular: Normal rate, regular rhythm and normal heart sounds. Exam reveals no gallop and no friction rub.  No murmur heard. Pulmonary/Chest: Effort normal and breath sounds normal. No respiratory distress. She has no wheezes. She has no rales. No breast tenderness or discharge.  Abdominal: Soft. Bowel sounds are normal. There is no tenderness. There is no rebound.  Genitourinary: No breast tenderness or discharge.  Musculoskeletal: Normal range of motion.  Lymphadenopathy:    She has no cervical adenopathy.  Neurological: She is alert and oriented to person, place, and time.  Skin: Skin is warm, dry and intact. No rash noted.  Psychiatric: She has a normal mood and affect. Her speech is normal and behavior is normal. Judgment and thought content normal. Cognition and memory are normal.    Results for orders placed or performed in visit on 10/26/16  CBC with Differential/Platelet  Result Value Ref Range   WBC 9.2 3.4 - 10.8  x10E3/uL   RBC 4.53 3.77 - 5.28 x10E6/uL   Hemoglobin 14.3 11.1 - 15.9 g/dL   Hematocrit 41.4 34.0 - 46.6 %   MCV 91 79 - 97 fL   MCH 31.6 26.6 - 33.0 pg   MCHC 34.5 31.5 - 35.7 g/dL   RDW 13.9 12.3 - 15.4 %   Platelets 432 (H) 150 - 379 x10E3/uL   Neutrophils 47 Not Estab. %   Lymphs 42 Not Estab. %   Monocytes 6 Not Estab. %   Eos 4 Not Estab. %   Basos 1 Not Estab. %   Neutrophils Absolute 4.3 1.4 - 7.0 x10E3/uL   Lymphocytes Absolute 3.9 (H) 0.7 - 3.1 x10E3/uL   Monocytes Absolute 0.6 0.1 - 0.9 x10E3/uL   EOS (ABSOLUTE) 0.3 0.0 - 0.4 x10E3/uL   Basophils Absolute 0.1 0.0 - 0.2 x10E3/uL   Immature Granulocytes 0 Not Estab. %   Immature Grans (Abs) 0.0 0.0 - 0.1 x10E3/uL  Comprehensive  metabolic panel  Result Value Ref Range   Glucose 101 (H) 65 - 99 mg/dL   BUN 15 6 - 24 mg/dL   Creatinine, Ser 0.91 0.57 - 1.00 mg/dL   GFR calc non Af Amer 74 >59 mL/min/1.73   GFR calc Af Amer 85 >59 mL/min/1.73   BUN/Creatinine Ratio 16 9 - 23   Sodium 141 134 - 144 mmol/L   Potassium 4.1 3.5 - 5.2 mmol/L   Chloride 103 96 - 106 mmol/L   CO2 22 20 - 29 mmol/L   Calcium 9.3 8.7 - 10.2 mg/dL   Total Protein 7.4 6.0 - 8.5 g/dL   Albumin 4.4 3.5 - 5.5 g/dL   Globulin, Total 3.0 1.5 - 4.5 g/dL   Albumin/Globulin Ratio 1.5 1.2 - 2.2   Bilirubin Total 0.3 0.0 - 1.2 mg/dL   Alkaline Phosphatase 79 39 - 117 IU/L   AST 22 0 - 40 IU/L   ALT 29 0 - 32 IU/L  TSH  Result Value Ref Range   TSH 41.170 (H) 0.450 - 4.500 uIU/mL  Sed Rate (ESR)  Result Value Ref Range   Sed Rate 29 0 - 40 mm/hr  C-reactive protein  Result Value Ref Range   CRP 2.5 0.0 - 4.9 mg/L  PTH, Intact and Calcium  Result Value Ref Range   PTH 18 15 - 65 pg/mL   PTH Interp Comment   B12  Result Value Ref Range   Vitamin B-12 471 232 - 1,245 pg/mL      Assessment & Plan:   Problem List Items Addressed This Visit      Unprioritized   Fibromyalgia    Goes to pain clinic.  Wants Prednisone.  States it was prescribed with rheumatology.  Discussed this will need to continue to be prescribed with rheumatology      Relevant Orders   Cortisol, urine, free   Frequent falls    Seeing Orthopedics.  Neurology referral is pending      Relevant Orders   CBC with Differential/Platelet   Cortisol, urine, free   Hypertension    Stable, continue present medications.  Check labs today       Relevant Orders   Comprehensive metabolic panel   Hypothyroidism    Check TSH today.  Also needs adrenal evaluation due to low DHEA. Will start with 24 hour urine for Cortisol.        Relevant Orders   TSH   Cortisol, urine, free    Other Visit Diagnoses  Lymphadenitis    -  Primary   right side of neck 2 tender  lymph nodes in mid cervical chain      Pt with hypothyroid and multiple potential indocrine disorders including a low DHEA along with diarrhea and hyper-emesis.  Will need testing with an endocrinologist for adrenal evaluation.    Pt planning to move soon.  Needs a rheumatologist (joint pain), endocrinologist (hypothyroid and drenal evaluation), and Neurologist (falls).    Follow up plan: Return for with primary care and specialists at new location.

## 2017-08-09 NOTE — Assessment & Plan Note (Signed)
Seeing Orthopedics.  Neurology referral is pending

## 2017-08-09 NOTE — Assessment & Plan Note (Addendum)
Goes to pain clinic.  Wants Prednisone.  States it was prescribed with rheumatology.  Discussed this will need to continue to be prescribed with rheumatology

## 2017-08-09 NOTE — Assessment & Plan Note (Signed)
Check TSH today.  Also needs adrenal evaluation due to low DHEA. Will start with 24 hour urine for Cortisol.

## 2017-08-10 ENCOUNTER — Telehealth: Payer: Self-pay | Admitting: Unknown Physician Specialty

## 2017-08-10 LAB — CBC WITH DIFFERENTIAL/PLATELET
BASOS: 1 %
Basophils Absolute: 0 10*3/uL (ref 0.0–0.2)
EOS (ABSOLUTE): 0.3 10*3/uL (ref 0.0–0.4)
Eos: 4 %
Hematocrit: 39 % (ref 34.0–46.6)
Hemoglobin: 13.1 g/dL (ref 11.1–15.9)
IMMATURE GRANULOCYTES: 0 %
Immature Grans (Abs): 0 10*3/uL (ref 0.0–0.1)
LYMPHS: 43 %
Lymphocytes Absolute: 3.4 10*3/uL — ABNORMAL HIGH (ref 0.7–3.1)
MCH: 31.1 pg (ref 26.6–33.0)
MCHC: 33.6 g/dL (ref 31.5–35.7)
MCV: 93 fL (ref 79–97)
MONOS ABS: 0.6 10*3/uL (ref 0.1–0.9)
Monocytes: 8 %
Neutrophils Absolute: 3.4 10*3/uL (ref 1.4–7.0)
Neutrophils: 44 %
Platelets: 375 10*3/uL (ref 150–450)
RBC: 4.21 x10E6/uL (ref 3.77–5.28)
RDW: 13.8 % (ref 12.3–15.4)
WBC: 7.8 10*3/uL (ref 3.4–10.8)

## 2017-08-10 LAB — COMPREHENSIVE METABOLIC PANEL
A/G RATIO: 1.3 (ref 1.2–2.2)
ALBUMIN: 4 g/dL (ref 3.5–5.5)
ALT: 50 IU/L — AB (ref 0–32)
AST: 51 IU/L — ABNORMAL HIGH (ref 0–40)
Alkaline Phosphatase: 89 IU/L (ref 39–117)
BILIRUBIN TOTAL: 0.9 mg/dL (ref 0.0–1.2)
BUN / CREAT RATIO: 8 — AB (ref 9–23)
BUN: 7 mg/dL (ref 6–24)
CALCIUM: 8.8 mg/dL (ref 8.7–10.2)
CHLORIDE: 101 mmol/L (ref 96–106)
CO2: 23 mmol/L (ref 20–29)
Creatinine, Ser: 0.86 mg/dL (ref 0.57–1.00)
GFR, EST AFRICAN AMERICAN: 90 mL/min/{1.73_m2} (ref >59)
GFR, EST NON AFRICAN AMERICAN: 78 mL/min/{1.73_m2} (ref >59)
Globulin, Total: 3 g/dL (ref 1.5–4.5)
Glucose: 75 mg/dL (ref 65–99)
POTASSIUM: 3.1 mmol/L — AB (ref 3.5–5.2)
Sodium: 143 mmol/L (ref 134–144)
TOTAL PROTEIN: 7 g/dL (ref 6.0–8.5)

## 2017-08-10 LAB — TSH: TSH: 1.04 u[IU]/mL (ref 0.450–4.500)

## 2017-08-10 NOTE — Telephone Encounter (Signed)
Copied from CRM [id number]. Topic: Quick Communication - See Telephone Encounter >> Aug 10, 2017  1:39 PM Lorrine KinMcGee, Allexus Ovens B, NT wrote: CRM for notification. See Telephone encounter for: 08/10/17. Patient calling and would like a call back regarding her AST and ALT. States that she has some questions. Please advise. [phone]

## 2017-08-10 NOTE — Progress Notes (Signed)
Normal labs.  Pt notified through mychart

## 2017-08-11 NOTE — Telephone Encounter (Signed)
Discussed elevated liver enzymes with pt.  Discussed needs to be rechecked in 3 months.  Pt planning on moving and finding a new primary care provider.  Emphasized this needs to be rechecked.

## 2017-08-11 NOTE — Telephone Encounter (Signed)
I have sent a reminder to myself to call the pt back to schedule appt if she hasn't moved.

## 2017-08-16 DIAGNOSIS — G894 Chronic pain syndrome: Secondary | ICD-10-CM | POA: Diagnosis not present

## 2017-08-16 DIAGNOSIS — M47816 Spondylosis without myelopathy or radiculopathy, lumbar region: Secondary | ICD-10-CM | POA: Diagnosis not present

## 2017-08-16 DIAGNOSIS — M545 Low back pain: Secondary | ICD-10-CM | POA: Diagnosis not present

## 2017-08-16 DIAGNOSIS — M25512 Pain in left shoulder: Secondary | ICD-10-CM | POA: Diagnosis not present

## 2017-08-16 DIAGNOSIS — Z79891 Long term (current) use of opiate analgesic: Secondary | ICD-10-CM | POA: Diagnosis not present

## 2017-09-06 ENCOUNTER — Other Ambulatory Visit: Payer: Self-pay | Admitting: Family Medicine

## 2017-09-06 DIAGNOSIS — Z90710 Acquired absence of both cervix and uterus: Secondary | ICD-10-CM

## 2017-09-06 NOTE — Telephone Encounter (Signed)
Estradiol 1 mg tablet refill request  Looks like this pt has moved and is no longer under care [facility] per notes from 7/11 and 08/11/17.   She was seeing Gabriel Cirriheryl Wicker.  [facility] [place]

## 2017-09-07 NOTE — Telephone Encounter (Signed)
Patient has not moved or established care elsewhere.

## 2017-09-07 NOTE — Telephone Encounter (Signed)
Did this patient establish care elsewhere? If she did will refill one month. If she did not, refill one month and please schedule follow up.

## 2017-09-08 ENCOUNTER — Other Ambulatory Visit: Payer: Self-pay

## 2017-09-08 DIAGNOSIS — G43109 Migraine with aura, not intractable, without status migrainosus: Secondary | ICD-10-CM

## 2017-09-08 MED ORDER — TOPIRAMATE 50 MG PO TABS: 50.0000 mg | ORAL_TABLET | Freq: Two times a day (BID) | ORAL | 0 refills | Status: DC

## 2017-09-08 NOTE — Telephone Encounter (Signed)
Needs follow up before any more refills, please schedule.

## 2017-09-08 NOTE — Telephone Encounter (Signed)
  Last routine OV: 08/09/2017 Next OV: None on file

## 2017-09-08 NOTE — Telephone Encounter (Signed)
30 days given, please schedule for follow up before more refills.

## 2017-09-11 ENCOUNTER — Ambulatory Visit: Payer: Self-pay | Admitting: Unknown Physician Specialty

## 2017-09-11 NOTE — Telephone Encounter (Signed)
I returned her call.    She is having bright red blood from her rectum.   "It's dripping into the toilet from my rectum".   "It's also in my stool".   "I've been once this morning but it is getting into my underwear".    I just noticed the blood this morning though I have been having this problem on and off for a while now.  Denies being dizzy.    See triage notes.  While attempting to get her an appt I was blocked from a 4:15 appt with Osvaldo AngstAdriana Pollak, PA-C for today.    I let the pt know I was going to call the flow coordinator and see what I could work out.   The pt informed me I was probably getting blocked because "I am a 30 minute appt".    In the meantime someone else was scheduled into the 4:15 time slot.   I spoke with the flow coordinator and there was not anything available so she was going to send a note to the providers to see if someone would be willing to see the pt. When I let the pt know there was nothing available today she said,  "I can't believe they can't see me when I have an urgent medical situation going on here".    "Also when I call in to have my prescriptions filled they tell me I have to be seen first".    "This is not the first time they have not been able to see me for an urgent concern".   "What is going on over there?"    I let her know we were trying our best to get her seen.    She did not feel comfortable with waiting until tomorrow which I agreed with.    "I think it's time for me to get a new doctor somewhere else."  I let her know I understood her frustration however let us see what we could do further before leaving the practice as a pt.    "I think it's best if I find another doctor that can see me when I need to".    She thanked me for my help and said,  "I hope you have a good day".     That ended the phone call.  I called the flow coordinator and let her know the pt's conversation.   They are going to reach out to her.    Reason for Disposition . MODERATE rectal  bleeding (small blood clots, passing blood without stool, or toilet water turns red)  Answer Assessment - Initial Assessment Questions 1. APPEARANCE of BLOOD: "What color is it?" "Is it passed separately, on the surface of the stool, or mixed in with the stool?"      Bright red blood from my rectum.   The blood is in my stool and in the water.    Blood is dripping out of my rectum.   Been having blood in my stools for a while now off and on.   My doctors are aware and haven't done anything about it.   I'm not constipated.   I have regular BMs.   I take a lot of pain medications that are not causing me to be constipated.   I go 2-3 times a day normally. 2. AMOUNT: "How much blood was passed?"      Today the blood is in my underwear and dripping into the toilet water. 3. FREQUENCY: "How many  times has blood been passed with the stools?"      Once. 4. ONSET: "When was the blood first seen in the stools?" (Days or weeks)      This morning. 5. DIARRHEA: "Is there also some diarrhea?" If so, ask: "How many diarrhea stools were passed in past 24 hours?"      No diarrhea. 6. CONSTIPATION: "Do you have constipation?" If so, "How bad is it?"     No 7. RECURRENT SYMPTOMS: "Have you had blood in your stools before?" If so, ask: "When was the last time?" and "What happened that time?"      Yes   On and off for a while.    8. BLOOD THINNERS: "Do you take any blood thinners?" (e.g., Coumadin/warfarin, Pradaxa/dabigatran, aspirin)     No 9. OTHER SYMPTOMS: "Do you have any other symptoms?"  (e.g., abdominal pain, vomiting, dizziness, fever)     In the past when blood in my stool I had terrible abd pain.   But not this time.    Did not see doctor for that episode. 10. PREGNANCY: "Is there any chance you are pregnant?" "When was your last menstrual period?"       Had a hysterectomy  Protocols used: RECTAL BLEEDING-A-AH

## 2017-09-11 NOTE — Telephone Encounter (Signed)
I can see her for that issue only at 1:45- otherwise I advise her to go to the urgent care for evaluation.

## 2017-09-11 NOTE — Telephone Encounter (Signed)
Called pt, no answer, LVM for pt to call back.  °

## 2017-09-13 ENCOUNTER — Ambulatory Visit: Payer: BLUE CROSS/BLUE SHIELD | Admitting: Physician Assistant

## 2017-09-18 ENCOUNTER — Other Ambulatory Visit: Payer: Self-pay | Admitting: Unknown Physician Specialty

## 2017-09-18 NOTE — Telephone Encounter (Signed)
   sumatriptan refill Last Refill:01/17/17 # 20 tab Last OV: 06/13/16 PCP: Osvaldo AngstAdriana Pollak PA Pharmacy:[facility] Last 2 BP's : 08/09/17 132/89                       10/26/17 137/88

## 2017-09-21 ENCOUNTER — Ambulatory Visit: Payer: BLUE CROSS/BLUE SHIELD | Admitting: Physician Assistant

## 2017-09-26 ENCOUNTER — Ambulatory Visit: Payer: BLUE CROSS/BLUE SHIELD | Admitting: Physician Assistant

## 2017-09-27 ENCOUNTER — Ambulatory Visit (INDEPENDENT_AMBULATORY_CARE_PROVIDER_SITE_OTHER): Payer: BLUE CROSS/BLUE SHIELD | Admitting: Physician Assistant

## 2017-09-27 ENCOUNTER — Encounter: Payer: Self-pay | Admitting: Physician Assistant

## 2017-09-27 VITALS — BP 164/95 | HR 86 | Temp 98.5°F | Wt 139.4 lb

## 2017-09-27 DIAGNOSIS — R5383 Other fatigue: Secondary | ICD-10-CM

## 2017-09-27 DIAGNOSIS — R1084 Generalized abdominal pain: Secondary | ICD-10-CM

## 2017-09-27 DIAGNOSIS — K625 Hemorrhage of anus and rectum: Secondary | ICD-10-CM | POA: Diagnosis not present

## 2017-09-27 NOTE — Progress Notes (Signed)
Subjective:    Patient ID: Alyssa Zuniga Isaza, female    DOB: October 08, 1965, 52 y.o.   MRN: [id number]  Alyssa Zuniga Hopf is a 52 y.o. female presenting on 09/27/2017 for Rectal Bleeding (pt states she has been having rectal bleeding and fatigue )   HPI   Patient presents today for multiple issues. She reports she has been having rectal bleeding x several weeks. She reports passing clots. Patient displays a picture on her phone which shows a small amount of blood with small clot on toilet tissue. This does not happen all the time, but it might happen several times a week. She is on chronic pain medications but reports she is not constipated. She is currently taking oxycontin 20 mg BID and also oxycodone. She denies fevers, chills, nausea, vomiting. She reports an episode of abdominal pain that was very severe but after that it has been a more moderate sense of abdominal pain. She does not have a uterus and denies the blood is from a vaginal source. She also denies blood from a urinary source. CBC w/ diff on 08/09/2017 showed no anemia. She denies dizziness and syncope, reports fatigue which seems to be longstanding.   She has fatigue, she feels like she can't get out of bed at all and wants to know why. Extremely exhausted all the time. Denies peripheral neuropathy. Denies any redness or swelling of the tongue. She wants to know if she can get B12 injections. No history of B12 deficiency.   Also reports that Elnita MaxwellCheryl ordered a 24 hour urine test that she did not complete because she got sick and she wants to order this again.   Social History   Tobacco Use  . Smoking status: Never Smoker  . Smokeless tobacco: Never Used  Substance Use Topics  . Alcohol use: Yes    Alcohol/week: 0.0 standard drinks    Comment: rare  . Drug use: No    Review of Systems Per HPI unless specifically indicated above     Objective:    BP (!) 164/95   Pulse 86   Temp 98.5 F (36.9 C) (Oral)   Wt 139 lb 6.4 oz  (63.2 kg)   LMP  (LMP Unknown)   SpO2 98%   BMI 25.50 kg/m   Wt Readings from Last 3 Encounters:  09/27/17 139 lb 6.4 oz (63.2 kg)  08/09/17 141 lb 3.2 oz (64 kg)  01/02/17 135 lb (61.2 kg)    Physical Exam  Constitutional: She is oriented to person, place, and time. She appears well-developed and well-nourished.  Cardiovascular: Normal rate and regular rhythm.  Pulmonary/Chest: Effort normal and breath sounds normal.  Abdominal: Soft. Bowel sounds are normal.  Neurological: She is alert and oriented to person, place, and time.  Skin: Skin is warm and dry.  Psychiatric: She has a normal mood and affect. Her behavior is normal.   Results for orders placed or performed in visit on 09/27/17  B12  Result Value Ref Range   Vitamin B-12 327 232 - 1,245 pg/mL  POC Hemoccult Bld/Stl (1-Cd Office Dx)  Result Value Ref Range   Card #1 Date Negative    Fecal Occult Blood, POC Negative Negative      Assessment & Plan:  1. Rectal bleeding  Hemoccoult today is negative. There are no hemorrhoids on exam. There is no evidence of rectal fissure. Minimal pain with DRE. She does not have abdominal tenderness nor infectious signs or symptoms today. Clinical presentation and history  does not align with diverticulitis or ischemic colitis. Colonoscopy in 2017 was normal. I would suggest she use 1-2 capfuls miralax daily to prevent any contribution from constipation, but otherwise she should see a GI doctor. She says that she will do this herself as she is moving to [place] and will see a doctor out there.   After addressing multiple issues in this visit and on my way out of the exam room, patient requests refills on all of her chronic medications. I explained to patient that she will need a visit devoted to these medications and the issues for which they are prescribed and she will have to come back for this.  - POC Hemoccult Bld/Stl (1-Cd Office Dx)  2. Generalized abdominal pain  - DG Abd 1 View;  Future  3. Fatigue, unspecified type  CBC and CMET normal on 07/2017. Patient requesting B12 levels for her fatigue. Counseled this is likely not covered by insurance under this diagnosis. She is also requesting urine cortisol test which PCP had ordered last time. PCP also recommended she see endocrinology which patient has not scheduled.    - B12 - Cortisol, urine, free  I have spent 25 minutes with this patient, >50% of which was spent on counseling and coordination of care.  Follow up plan: Return if symptoms worsen or fail to improve.  Osvaldo Angst, PA-C [facility] 09/28/2017, 12:05 PM

## 2017-09-28 LAB — VITAMIN B12: Vitamin B-12: 327 pg/mL (ref 232–1245)

## 2017-09-28 LAB — POC HEMOCCULT BLD/STL (OFFICE/1-CARD/DIAGNOSTIC)
Card #1 Date: NEGATIVE
Fecal Occult Blood, POC: NEGATIVE

## 2017-09-28 NOTE — Patient Instructions (Signed)
Rectal Bleeding °Rectal bleeding is when blood comes out of the opening of the butt (anus). People with this kind of bleeding may notice bright red blood in their underwear or in the toilet after they poop (have a bowel movement). They may also have dark red or black poop (stool). Rectal bleeding is often a sign that something is wrong. It needs to be checked by a doctor. °Follow these instructions at home: °Watch for any changes in your condition. Take these actions to help with bleeding and discomfort: °· Eat a diet that is high in fiber. This will keep your poop soft so it is easier for you to poop without pushing too hard. Ask your doctor to tell you what foods and drinks are high in fiber. °· Drink enough fluid to keep your pee (urine) clear or pale yellow. This also helps keep your poop soft. °· Try taking a warm bath. This may help with pain. °· Keep all follow-up visits as told by your doctor. This is important. ° °Get help right away if: °· You have new bleeding. °· You have more bleeding than before. °· You have black or dark red poop. °· You throw up (vomit) blood or something that looks like coffee grounds. °· You have pain or tenderness in your belly (abdomen). °· You have a fever. °· You feel weak. °· You feel sick to your stomach (nauseous). °· You pass out (faint). °· You have very bad pain in your butt. °· You cannot poop. °This information is not intended to replace advice given to you by your health care provider. Make sure you discuss any questions you have with your health care provider. °Document Released: 09/29/2010 Document Revised: 06/25/2015 Document Reviewed: 03/15/2015 °Elsevier Interactive Patient Education © 2018 [employer] ° °

## 2017-10-06 ENCOUNTER — Ambulatory Visit (INDEPENDENT_AMBULATORY_CARE_PROVIDER_SITE_OTHER): Payer: BLUE CROSS/BLUE SHIELD | Admitting: Unknown Physician Specialty

## 2017-10-06 ENCOUNTER — Encounter: Payer: Self-pay | Admitting: Unknown Physician Specialty

## 2017-10-06 VITALS — BP 134/66 | HR 84 | Temp 98.4°F | Ht 62.0 in | Wt 142.3 lb

## 2017-10-06 DIAGNOSIS — G43109 Migraine with aura, not intractable, without status migrainosus: Secondary | ICD-10-CM

## 2017-10-06 DIAGNOSIS — Z90710 Acquired absence of both cervix and uterus: Secondary | ICD-10-CM | POA: Diagnosis not present

## 2017-10-06 DIAGNOSIS — N951 Menopausal and female climacteric states: Secondary | ICD-10-CM

## 2017-10-06 DIAGNOSIS — I10 Essential (primary) hypertension: Secondary | ICD-10-CM

## 2017-10-06 DIAGNOSIS — E039 Hypothyroidism, unspecified: Secondary | ICD-10-CM

## 2017-10-06 DIAGNOSIS — G4733 Obstructive sleep apnea (adult) (pediatric): Secondary | ICD-10-CM

## 2017-10-06 MED ORDER — AMITRIPTYLINE HCL 100 MG PO TABS: 100.0000 mg | ORAL_TABLET | Freq: Every day | ORAL | 1 refills | Status: AC

## 2017-10-06 MED ORDER — ESTRADIOL 1 MG PO TABS: 1.0000 mg | ORAL_TABLET | Freq: Every day | ORAL | 1 refills | Status: AC

## 2017-10-06 MED ORDER — SUMATRIPTAN SUCCINATE 100 MG PO TABS: ORAL_TABLET | ORAL | 2 refills | Status: AC

## 2017-10-06 MED ORDER — LEVOTHYROXINE SODIUM 112 MCG PO TABS: 112.0000 ug | ORAL_TABLET | Freq: Every day | ORAL | 3 refills | Status: DC

## 2017-10-06 MED ORDER — TRIAMTERENE-HCTZ 37.5-25 MG PO CAPS: 1.0000 | ORAL_CAPSULE | Freq: Every day | ORAL | 3 refills | Status: AC

## 2017-10-06 NOTE — Assessment & Plan Note (Signed)
Stable, continue present medications.   

## 2017-10-06 NOTE — Assessment & Plan Note (Signed)
Refill Estadiol.  Choosed ot get physical with new provider as she is moving

## 2017-10-06 NOTE — Assessment & Plan Note (Signed)
Increasing frequency.  Pt would like to try new medication.  Will refer to neurology for insurance coverage

## 2017-10-06 NOTE — Assessment & Plan Note (Signed)
Snores and wakes up non-rested.  Suspect related to migraines as well.  Will refer for a sleep study

## 2017-10-06 NOTE — Progress Notes (Signed)
BP 134/66 (BP Location: Left Arm, Patient Position: Sitting, Cuff Size: Normal)   Pulse 84   Temp 98.4 F (36.9 C) (Oral)   Ht 5\' 2"  (1.575 m)   Wt 142 lb 4.8 oz (64.5 kg)   LMP  (LMP Unknown)   SpO2 97%   BMI 26.03 kg/m    Subjective:    Patient ID: Alyssa Zuniga, female    DOB: 1965-06-30, 52 y.o.   MRN: [id number]  HPI: Alyssa Zuniga is a 52 y.o. female  Chief Complaint  Patient presents with  . Medication Refill   Pt states she needs refills of several medications: Estradiol, Levothyroxine, BP meds, Amitriptyline, Topamax Imitrix  Migraines Pt with migraines that seem to be getting worse.  She is taking Topamax and Amitriptyline though on a prn basis.  Has been lost to f/u with neurology and has set up an appt for a new provider.  States migraines are now coming 3-4 times/week.  Wondering about new injectables.    Hypertension Using medications without difficulty Average home BPs: not checking   No problems or lightheadedness No chest pain with exertion or shortness of breath No Edema  Menopause Needs Estradiol secondary to total hysterectomy at the age of 13 due to bleeding.  No cancer found  Hypothyroid Last TSH 1.040 on 08/09/17.  Complaints of fatigue.  Wakes up non-rested.  Admits to "snoring like a freight train."    Relevant past medical, surgical, family and social history reviewed and updated as indicated. Interim medical history since our last visit reviewed. Allergies and medications reviewed and updated.  Review of Systems  Per HPI unless specifically indicated above     Objective:    BP 134/66 (BP Location: Left Arm, Patient Position: Sitting, Cuff Size: Normal)   Pulse 84   Temp 98.4 F (36.9 C) (Oral)   Ht 5\' 2"  (1.575 m)   Wt 142 lb 4.8 oz (64.5 kg)   LMP  (LMP Unknown)   SpO2 97%   BMI 26.03 kg/m   Wt Readings from Last 3 Encounters:  10/06/17 142 lb 4.8 oz (64.5 kg)  09/27/17 139 lb 6.4 oz (63.2 kg)  08/09/17 141 lb 3.2 oz  (64 kg)    Physical Exam  Constitutional: She is oriented to person, place, and time. She appears well-developed and well-nourished. No distress.  HENT:  Head: Normocephalic and atraumatic.  Eyes: Conjunctivae and lids are normal. Right eye exhibits no discharge. Left eye exhibits no discharge. No scleral icterus.  Neck: Normal range of motion. Neck supple. No JVD present. Carotid bruit is not present.  Cardiovascular: Normal rate, regular rhythm and normal heart sounds.  Pulmonary/Chest: Effort normal and breath sounds normal.  Abdominal: Normal appearance. There is no splenomegaly or hepatomegaly.  Musculoskeletal: Normal range of motion.  Neurological: She is alert and oriented to person, place, and time.  Skin: Skin is warm, dry and intact. No rash noted. No pallor.  Psychiatric: She has a normal mood and affect. Her behavior is normal. Judgment and thought content normal.    Results for orders placed or performed in visit on 09/27/17  B12  Result Value Ref Range   Vitamin B-12 327 232 - 1,245 pg/mL  POC Hemoccult Bld/Stl (1-Cd Office Dx)  Result Value Ref Range   Card #1 Date Negative    Fecal Occult Blood, POC Negative Negative      Assessment & Plan:   Problem List Items Addressed This Visit  Unprioritized   Benign hypertension    Stable, continue present medications.        Relevant Medications   triamterene-hydrochlorothiazide (DYAZIDE) 37.5-25 MG capsule   Hypothyroidism    Recent visit euthyroid.  Suspect fatigue related to sleep apnea      Relevant Medications   levothyroxine (SYNTHROID, LEVOTHROID) 112 MCG tablet   Menopausal symptom    Refill Estadiol.  Choosed ot get physical with new provider as she is moving      Migraines    Increasing frequency.  Pt would like to try new medication.  Will refer to neurology for insurance coverage      Relevant Medications   amitriptyline (ELAVIL) 100 MG tablet   triamterene-hydrochlorothiazide (DYAZIDE)  37.5-25 MG capsule   SUMAtriptan (IMITREX) 100 MG tablet   Obstructive sleep apnea syndrome - Primary    Snores and wakes up non-rested.  Suspect related to migraines as well.  Will refer for a sleep study      Relevant Orders   Ambulatory referral to Sleep Studies    Other Visit Diagnoses    H/O: hysterectomy       Relevant Medications   estradiol (ESTRACE) 1 MG tablet       Follow up plan: Return if symptoms worsen or fail to improve.

## 2017-10-06 NOTE — Assessment & Plan Note (Signed)
Recent visit euthyroid.  Suspect fatigue related to sleep apnea

## 2017-10-16 ENCOUNTER — Other Ambulatory Visit: Payer: Self-pay | Admitting: Physician Assistant

## 2017-10-16 DIAGNOSIS — G43109 Migraine with aura, not intractable, without status migrainosus: Secondary | ICD-10-CM

## 2017-10-17 ENCOUNTER — Other Ambulatory Visit: Payer: Self-pay | Admitting: Physician Assistant

## 2017-10-18 NOTE — Telephone Encounter (Signed)
Last (acute) OV:  Last routine OV: 10/06/17 Next OV: Note on file

## 2017-10-24 DIAGNOSIS — M47816 Spondylosis without myelopathy or radiculopathy, lumbar region: Secondary | ICD-10-CM | POA: Diagnosis not present

## 2017-10-24 DIAGNOSIS — G894 Chronic pain syndrome: Secondary | ICD-10-CM | POA: Diagnosis not present

## 2017-10-24 DIAGNOSIS — M25512 Pain in left shoulder: Secondary | ICD-10-CM | POA: Diagnosis not present

## 2017-10-24 DIAGNOSIS — M545 Low back pain: Secondary | ICD-10-CM | POA: Diagnosis not present

## 2017-10-25 ENCOUNTER — Encounter: Payer: Self-pay | Admitting: Unknown Physician Specialty

## 2017-11-13 DIAGNOSIS — M542 Cervicalgia: Secondary | ICD-10-CM | POA: Diagnosis not present

## 2017-11-13 DIAGNOSIS — S39012A Strain of muscle, fascia and tendon of lower back, initial encounter: Secondary | ICD-10-CM | POA: Diagnosis not present

## 2017-12-04 ENCOUNTER — Telehealth: Payer: Self-pay | Admitting: Nurse Practitioner

## 2017-12-04 DIAGNOSIS — G43709 Chronic migraine without aura, not intractable, without status migrainosus: Secondary | ICD-10-CM

## 2017-12-04 NOTE — Telephone Encounter (Signed)
Per CRM received patient is requesting a referral be sent to Dr. Delena Serve in [place], [place].  Telephone number [phone] and fax [phone] as she is having continued nerve pain, numbness, and tingling all over + recurrent migraines.  She has been seen for this by other providers in past and is now wanting referral sent to this neuro provider for further assessment.  Will send referral per patient request since she has been seen for this issues in this clinic in past.

## 2018-01-16 DIAGNOSIS — Z5181 Encounter for therapeutic drug level monitoring: Secondary | ICD-10-CM | POA: Diagnosis not present

## 2018-01-16 DIAGNOSIS — Z79891 Long term (current) use of opiate analgesic: Secondary | ICD-10-CM | POA: Diagnosis not present

## 2018-01-16 DIAGNOSIS — M47816 Spondylosis without myelopathy or radiculopathy, lumbar region: Secondary | ICD-10-CM | POA: Diagnosis not present

## 2018-01-16 DIAGNOSIS — M545 Low back pain: Secondary | ICD-10-CM | POA: Diagnosis not present

## 2018-01-16 DIAGNOSIS — M25512 Pain in left shoulder: Secondary | ICD-10-CM | POA: Diagnosis not present

## 2018-01-16 DIAGNOSIS — G894 Chronic pain syndrome: Secondary | ICD-10-CM | POA: Diagnosis not present

## 2018-02-20 DIAGNOSIS — Z79899 Other long term (current) drug therapy: Secondary | ICD-10-CM | POA: Diagnosis not present

## 2018-02-20 DIAGNOSIS — R51 Headache: Secondary | ICD-10-CM | POA: Diagnosis not present

## 2018-02-20 DIAGNOSIS — I10 Essential (primary) hypertension: Secondary | ICD-10-CM | POA: Diagnosis not present

## 2018-02-20 DIAGNOSIS — Z880 Allergy status to penicillin: Secondary | ICD-10-CM | POA: Diagnosis not present

## 2018-02-20 DIAGNOSIS — G8929 Other chronic pain: Secondary | ICD-10-CM | POA: Diagnosis not present

## 2018-02-20 DIAGNOSIS — Z79891 Long term (current) use of opiate analgesic: Secondary | ICD-10-CM | POA: Diagnosis not present

## 2018-02-20 DIAGNOSIS — M542 Cervicalgia: Secondary | ICD-10-CM | POA: Diagnosis not present

## 2018-02-20 DIAGNOSIS — Z882 Allergy status to sulfonamides status: Secondary | ICD-10-CM | POA: Diagnosis not present

## 2018-02-22 DIAGNOSIS — I10 Essential (primary) hypertension: Secondary | ICD-10-CM | POA: Diagnosis not present

## 2018-02-22 DIAGNOSIS — R0902 Hypoxemia: Secondary | ICD-10-CM | POA: Diagnosis not present

## 2018-02-22 DIAGNOSIS — E860 Dehydration: Secondary | ICD-10-CM | POA: Diagnosis not present

## 2018-02-22 DIAGNOSIS — R000 Tachycardia, unspecified: Secondary | ICD-10-CM | POA: Diagnosis not present

## 2018-03-21 ENCOUNTER — Other Ambulatory Visit: Payer: Self-pay | Admitting: Family Medicine

## 2018-03-21 DIAGNOSIS — G43109 Migraine with aura, not intractable, without status migrainosus: Secondary | ICD-10-CM

## 2018-03-22 NOTE — Telephone Encounter (Signed)
Requested medication (s) are due for refill today: Yes  Requested medication (s) are on the active medication list: Yes  Last refill:  10/17/17  Future visit scheduled: No  Notes to clinic:  Olevia Perches    Requested Prescriptions  Pending Prescriptions Disp Refills   topiramate (TOPAMAX) 50 MG tablet [Pharmacy Med Name: TOPIRAMATE 50 MG TABLET] 180 tablet 1    Sig: TAKE 1 TABLET BY MOUTH TWICE A DAY     Not Delegated - Neurology: Anticonvulsants - topiramate & zonisamide Failed - 03/21/2018  7:43 PM      Failed - This refill cannot be delegated      Passed - Cr in normal range and within 360 days    Creatinine, Ser  Date Value Ref Range Status  08/09/2017 0.86 0.57 - 1.00 mg/dL Final         Passed - CO2 in normal range and within 360 days    CO2  Date Value Ref Range Status  08/09/2017 23 20 - 29 mmol/L Final         Passed - Valid encounter within last 12 months    Recent Outpatient Visits          5 months ago Obstructive sleep apnea syndrome   [facility] Gabriel Cirri, NP   5 months ago Rectal bleeding   [facility] Osvaldo Angst M, PA-C   7 months ago Lymphadenitis   [facility] Gabriel Cirri, NP   1 year ago Hypothyroidism, unspecified type   [facility] Gabriel Cirri, NP   1 year ago Incontinence of feces, unspecified fecal incontinence type   [facility] Gabriel Cirri, NP

## 2018-03-23 DIAGNOSIS — M79604 Pain in right leg: Secondary | ICD-10-CM | POA: Diagnosis not present

## 2018-03-23 DIAGNOSIS — K760 Fatty (change of) liver, not elsewhere classified: Secondary | ICD-10-CM | POA: Diagnosis not present

## 2018-03-23 DIAGNOSIS — R51 Headache: Secondary | ICD-10-CM | POA: Diagnosis not present

## 2018-03-23 DIAGNOSIS — M79605 Pain in left leg: Secondary | ICD-10-CM | POA: Diagnosis not present

## 2018-04-03 DIAGNOSIS — G43709 Chronic migraine without aura, not intractable, without status migrainosus: Secondary | ICD-10-CM | POA: Diagnosis not present

## 2018-04-03 DIAGNOSIS — G629 Polyneuropathy, unspecified: Secondary | ICD-10-CM | POA: Diagnosis not present

## 2018-04-03 DIAGNOSIS — M62838 Other muscle spasm: Secondary | ICD-10-CM | POA: Diagnosis not present

## 2018-04-03 DIAGNOSIS — M5481 Occipital neuralgia: Secondary | ICD-10-CM | POA: Diagnosis not present

## 2018-04-05 DIAGNOSIS — M5441 Lumbago with sciatica, right side: Secondary | ICD-10-CM | POA: Diagnosis not present

## 2018-04-05 DIAGNOSIS — M542 Cervicalgia: Secondary | ICD-10-CM | POA: Diagnosis not present

## 2018-04-05 DIAGNOSIS — M5442 Lumbago with sciatica, left side: Secondary | ICD-10-CM | POA: Diagnosis not present

## 2018-04-09 ENCOUNTER — Other Ambulatory Visit: Payer: Self-pay | Admitting: Unknown Physician Specialty

## 2018-04-09 DIAGNOSIS — M5442 Lumbago with sciatica, left side: Secondary | ICD-10-CM | POA: Diagnosis not present

## 2018-04-09 DIAGNOSIS — M4727 Other spondylosis with radiculopathy, lumbosacral region: Secondary | ICD-10-CM | POA: Diagnosis not present

## 2018-04-09 DIAGNOSIS — M4726 Other spondylosis with radiculopathy, lumbar region: Secondary | ICD-10-CM | POA: Diagnosis not present

## 2018-04-09 DIAGNOSIS — M5441 Lumbago with sciatica, right side: Secondary | ICD-10-CM | POA: Diagnosis not present

## 2018-04-09 DIAGNOSIS — Z90710 Acquired absence of both cervix and uterus: Secondary | ICD-10-CM

## 2018-04-09 DIAGNOSIS — M5116 Intervertebral disc disorders with radiculopathy, lumbar region: Secondary | ICD-10-CM | POA: Diagnosis not present

## 2018-04-09 NOTE — Telephone Encounter (Signed)
Requested medication (s) are due for refill today: yes  Requested medication (s) are on the active medication list: yes  Last refill:  10/06/17  Future visit scheduled: no  Notes to clinic:  Estrogens failed  Requested Prescriptions  Pending Prescriptions Disp Refills   estradiol (ESTRACE) 1 MG tablet [Pharmacy Med Name: ESTRADIOL 1 MG TABLET] 90 tablet 0    Sig: TAKE 1 TABLET BY MOUTH EVERY DAY     OB/GYN:  Estrogens Failed - 04/09/2018 11:33 AM      Failed - Mammogram is up-to-date per Health Maintenance      Passed - Last BP in normal range    BP Readings from Last 1 Encounters:  10/06/17 134/66         Passed - Valid encounter within last 12 months    Recent Outpatient Visits          6 months ago Obstructive sleep apnea syndrome   [facility] Gabriel Cirri, NP   6 months ago Rectal bleeding   [facility] Osvaldo Angst M, PA-C   8 months ago Lymphadenitis   [facility] Gabriel Cirri, NP   1 year ago Hypothyroidism, unspecified type   [facility] Gabriel Cirri, NP   1 year ago Incontinence of feces, unspecified fecal incontinence type   [facility] Gabriel Cirri, NP

## 2018-04-13 DIAGNOSIS — G629 Polyneuropathy, unspecified: Secondary | ICD-10-CM | POA: Diagnosis not present

## 2018-04-13 DIAGNOSIS — M542 Cervicalgia: Secondary | ICD-10-CM | POA: Diagnosis not present

## 2018-04-13 DIAGNOSIS — R6889 Other general symptoms and signs: Secondary | ICD-10-CM | POA: Diagnosis not present

## 2018-04-14 ENCOUNTER — Other Ambulatory Visit: Payer: Self-pay | Admitting: Family Medicine

## 2018-04-14 DIAGNOSIS — G43109 Migraine with aura, not intractable, without status migrainosus: Secondary | ICD-10-CM

## 2018-04-17 DIAGNOSIS — E039 Hypothyroidism, unspecified: Secondary | ICD-10-CM | POA: Diagnosis not present

## 2018-04-17 DIAGNOSIS — K920 Hematemesis: Secondary | ICD-10-CM | POA: Diagnosis not present

## 2018-04-17 DIAGNOSIS — G629 Polyneuropathy, unspecified: Secondary | ICD-10-CM | POA: Diagnosis not present

## 2018-04-17 DIAGNOSIS — I10 Essential (primary) hypertension: Secondary | ICD-10-CM | POA: Diagnosis not present

## 2018-04-18 DIAGNOSIS — H04123 Dry eye syndrome of bilateral lacrimal glands: Secondary | ICD-10-CM | POA: Diagnosis not present

## 2018-04-18 DIAGNOSIS — M170 Bilateral primary osteoarthritis of knee: Secondary | ICD-10-CM | POA: Diagnosis not present

## 2018-04-18 DIAGNOSIS — R768 Other specified abnormal immunological findings in serum: Secondary | ICD-10-CM | POA: Diagnosis not present

## 2018-04-25 DIAGNOSIS — M47816 Spondylosis without myelopathy or radiculopathy, lumbar region: Secondary | ICD-10-CM | POA: Diagnosis not present

## 2018-04-25 DIAGNOSIS — M25512 Pain in left shoulder: Secondary | ICD-10-CM | POA: Diagnosis not present

## 2018-04-25 DIAGNOSIS — M545 Low back pain: Secondary | ICD-10-CM | POA: Diagnosis not present

## 2018-04-25 DIAGNOSIS — G894 Chronic pain syndrome: Secondary | ICD-10-CM | POA: Diagnosis not present

## 2018-04-26 DIAGNOSIS — D519 Vitamin B12 deficiency anemia, unspecified: Secondary | ICD-10-CM | POA: Diagnosis not present

## 2018-04-26 DIAGNOSIS — M549 Dorsalgia, unspecified: Secondary | ICD-10-CM | POA: Diagnosis not present

## 2018-05-22 DIAGNOSIS — Z0189 Encounter for other specified special examinations: Secondary | ICD-10-CM | POA: Diagnosis not present

## 2018-05-22 DIAGNOSIS — M2550 Pain in unspecified joint: Secondary | ICD-10-CM | POA: Diagnosis not present

## 2018-05-22 DIAGNOSIS — M79641 Pain in right hand: Secondary | ICD-10-CM | POA: Diagnosis not present

## 2018-05-23 DIAGNOSIS — M47816 Spondylosis without myelopathy or radiculopathy, lumbar region: Secondary | ICD-10-CM | POA: Diagnosis not present

## 2018-05-23 DIAGNOSIS — G894 Chronic pain syndrome: Secondary | ICD-10-CM | POA: Diagnosis not present

## 2018-05-23 DIAGNOSIS — M545 Low back pain: Secondary | ICD-10-CM | POA: Diagnosis not present

## 2018-05-23 DIAGNOSIS — M25512 Pain in left shoulder: Secondary | ICD-10-CM | POA: Diagnosis not present

## 2018-05-30 ENCOUNTER — Other Ambulatory Visit: Payer: Self-pay | Admitting: Family Medicine

## 2018-05-30 DIAGNOSIS — G43109 Migraine with aura, not intractable, without status migrainosus: Secondary | ICD-10-CM

## 2018-06-11 DIAGNOSIS — Z880 Allergy status to penicillin: Secondary | ICD-10-CM | POA: Diagnosis not present

## 2018-06-11 DIAGNOSIS — M545 Low back pain: Secondary | ICD-10-CM | POA: Diagnosis not present

## 2018-06-11 DIAGNOSIS — Z882 Allergy status to sulfonamides status: Secondary | ICD-10-CM | POA: Diagnosis not present

## 2018-06-11 DIAGNOSIS — M549 Dorsalgia, unspecified: Secondary | ICD-10-CM | POA: Diagnosis not present

## 2018-06-11 DIAGNOSIS — Z803 Family history of malignant neoplasm of breast: Secondary | ICD-10-CM | POA: Diagnosis not present

## 2018-06-11 DIAGNOSIS — M4800 Spinal stenosis, site unspecified: Secondary | ICD-10-CM | POA: Diagnosis not present

## 2018-06-11 DIAGNOSIS — M5136 Other intervertebral disc degeneration, lumbar region: Secondary | ICD-10-CM | POA: Diagnosis not present

## 2018-06-11 DIAGNOSIS — M48061 Spinal stenosis, lumbar region without neurogenic claudication: Secondary | ICD-10-CM | POA: Diagnosis not present

## 2018-06-11 DIAGNOSIS — I10 Essential (primary) hypertension: Secondary | ICD-10-CM | POA: Diagnosis not present

## 2018-06-19 DIAGNOSIS — M47816 Spondylosis without myelopathy or radiculopathy, lumbar region: Secondary | ICD-10-CM | POA: Diagnosis not present

## 2018-06-19 DIAGNOSIS — M5124 Other intervertebral disc displacement, thoracic region: Secondary | ICD-10-CM | POA: Diagnosis not present

## 2018-06-26 ENCOUNTER — Other Ambulatory Visit: Payer: Self-pay | Admitting: Family Medicine

## 2018-07-04 DIAGNOSIS — M47816 Spondylosis without myelopathy or radiculopathy, lumbar region: Secondary | ICD-10-CM | POA: Diagnosis not present

## 2018-07-04 DIAGNOSIS — G894 Chronic pain syndrome: Secondary | ICD-10-CM | POA: Diagnosis not present

## 2018-07-04 DIAGNOSIS — M549 Dorsalgia, unspecified: Secondary | ICD-10-CM | POA: Diagnosis not present

## 2018-07-04 DIAGNOSIS — G8929 Other chronic pain: Secondary | ICD-10-CM | POA: Diagnosis not present

## 2018-07-04 DIAGNOSIS — M545 Low back pain: Secondary | ICD-10-CM | POA: Diagnosis not present

## 2018-07-04 DIAGNOSIS — M25512 Pain in left shoulder: Secondary | ICD-10-CM | POA: Diagnosis not present

## 2018-07-09 DIAGNOSIS — H900 Conductive hearing loss, bilateral: Secondary | ICD-10-CM | POA: Diagnosis not present

## 2018-07-09 DIAGNOSIS — H903 Sensorineural hearing loss, bilateral: Secondary | ICD-10-CM | POA: Diagnosis not present

## 2018-07-09 DIAGNOSIS — H6121 Impacted cerumen, right ear: Secondary | ICD-10-CM | POA: Diagnosis not present

## 2018-07-12 DIAGNOSIS — G629 Polyneuropathy, unspecified: Secondary | ICD-10-CM | POA: Diagnosis not present

## 2018-07-12 DIAGNOSIS — G5601 Carpal tunnel syndrome, right upper limb: Secondary | ICD-10-CM | POA: Diagnosis not present

## 2018-07-12 DIAGNOSIS — R202 Paresthesia of skin: Secondary | ICD-10-CM | POA: Diagnosis not present

## 2018-08-08 DIAGNOSIS — R208 Other disturbances of skin sensation: Secondary | ICD-10-CM | POA: Diagnosis not present

## 2018-08-08 DIAGNOSIS — G629 Polyneuropathy, unspecified: Secondary | ICD-10-CM | POA: Diagnosis not present

## 2018-08-20 DIAGNOSIS — R21 Rash and other nonspecific skin eruption: Secondary | ICD-10-CM | POA: Diagnosis not present

## 2018-08-28 DIAGNOSIS — G894 Chronic pain syndrome: Secondary | ICD-10-CM | POA: Diagnosis not present

## 2018-08-28 DIAGNOSIS — M545 Low back pain: Secondary | ICD-10-CM | POA: Diagnosis not present

## 2018-08-28 DIAGNOSIS — M25512 Pain in left shoulder: Secondary | ICD-10-CM | POA: Diagnosis not present

## 2018-08-28 DIAGNOSIS — M47816 Spondylosis without myelopathy or radiculopathy, lumbar region: Secondary | ICD-10-CM | POA: Diagnosis not present

## 2018-08-31 DIAGNOSIS — E538 Deficiency of other specified B group vitamins: Secondary | ICD-10-CM | POA: Diagnosis not present

## 2018-08-31 DIAGNOSIS — R768 Other specified abnormal immunological findings in serum: Secondary | ICD-10-CM | POA: Diagnosis not present

## 2018-08-31 DIAGNOSIS — M2550 Pain in unspecified joint: Secondary | ICD-10-CM | POA: Diagnosis not present

## 2018-08-31 DIAGNOSIS — G5601 Carpal tunnel syndrome, right upper limb: Secondary | ICD-10-CM | POA: Diagnosis not present

## 2018-08-31 DIAGNOSIS — G43709 Chronic migraine without aura, not intractable, without status migrainosus: Secondary | ICD-10-CM | POA: Diagnosis not present

## 2018-09-18 DIAGNOSIS — R109 Unspecified abdominal pain: Secondary | ICD-10-CM | POA: Diagnosis not present

## 2018-09-18 DIAGNOSIS — R634 Abnormal weight loss: Secondary | ICD-10-CM | POA: Diagnosis not present

## 2018-09-23 ENCOUNTER — Other Ambulatory Visit: Payer: Self-pay | Admitting: Unknown Physician Specialty

## 2018-09-23 DIAGNOSIS — Z90710 Acquired absence of both cervix and uterus: Secondary | ICD-10-CM

## 2018-09-24 NOTE — Telephone Encounter (Signed)
Routing to provider  

## 2018-09-24 NOTE — Telephone Encounter (Signed)
No visit since September 2019, will need visit.

## 2018-09-24 NOTE — Telephone Encounter (Signed)
Requested medication (s) are due for refill today: yes  Requested medication (s) are on the active medication list: yes  Last refill:  10/06/2017  Future visit scheduled: no  Notes to clinic:  vm left for patient to call and schedule office visit    Requested Prescriptions  Pending Prescriptions Disp Refills   estradiol (ESTRACE) 1 MG tablet [Pharmacy Med Name: ESTRADIOL 1 MG TABLET] 90 tablet 0    Sig: TAKE 1 TABLET BY MOUTH EVERY DAY     OB/GYN:  Estrogens Failed - 09/23/2018  3:11 PM      Failed - Mammogram is up-to-date per Health Maintenance      Passed - Last BP in normal range    BP Readings from Last 1 Encounters:  10/06/17 134/66         Passed - Valid encounter within last 12 months    Recent Outpatient Visits          11 months ago Obstructive sleep apnea syndrome   [facility] Kathrine Haddock, NP   12 months ago Rectal bleeding   [facility] Trinna Post, [place]   1 year ago Lymphadenitis   [facility] Kathrine Haddock, NP   1 year ago Hypothyroidism, unspecified type   [facility] Kathrine Haddock, NP   2 years ago Incontinence of feces, unspecified fecal incontinence type   [facility] Kathrine Haddock, NP

## 2018-09-25 DIAGNOSIS — F411 Generalized anxiety disorder: Secondary | ICD-10-CM | POA: Diagnosis not present

## 2018-09-25 DIAGNOSIS — F410 Panic disorder [episodic paroxysmal anxiety] without agoraphobia: Secondary | ICD-10-CM | POA: Diagnosis not present

## 2018-09-25 DIAGNOSIS — F341 Dysthymic disorder: Secondary | ICD-10-CM | POA: Diagnosis not present

## 2018-09-25 DIAGNOSIS — F4312 Post-traumatic stress disorder, chronic: Secondary | ICD-10-CM | POA: Diagnosis not present

## 2018-09-25 NOTE — Telephone Encounter (Signed)
Called patient. Left detailed VM (DPR reviewed) asked patient to return call to schedule an appt

## 2018-09-26 NOTE — Telephone Encounter (Signed)
Called and spoke with patient. She stated that she is taking care of the Rx refill and did not need anything else at this time.

## 2018-10-05 DIAGNOSIS — L309 Dermatitis, unspecified: Secondary | ICD-10-CM | POA: Diagnosis not present

## 2018-10-05 DIAGNOSIS — E039 Hypothyroidism, unspecified: Secondary | ICD-10-CM | POA: Diagnosis not present

## 2018-10-19 DIAGNOSIS — F410 Panic disorder [episodic paroxysmal anxiety] without agoraphobia: Secondary | ICD-10-CM | POA: Diagnosis not present

## 2018-10-19 DIAGNOSIS — F341 Dysthymic disorder: Secondary | ICD-10-CM | POA: Diagnosis not present

## 2018-10-19 DIAGNOSIS — F411 Generalized anxiety disorder: Secondary | ICD-10-CM | POA: Diagnosis not present

## 2018-10-19 DIAGNOSIS — F4312 Post-traumatic stress disorder, chronic: Secondary | ICD-10-CM | POA: Diagnosis not present

## 2018-10-22 DIAGNOSIS — G629 Polyneuropathy, unspecified: Secondary | ICD-10-CM | POA: Diagnosis not present

## 2018-10-22 DIAGNOSIS — R1310 Dysphagia, unspecified: Secondary | ICD-10-CM | POA: Diagnosis not present

## 2018-10-23 ENCOUNTER — Other Ambulatory Visit: Payer: Self-pay | Admitting: Unknown Physician Specialty

## 2018-10-23 DIAGNOSIS — M25512 Pain in left shoulder: Secondary | ICD-10-CM | POA: Diagnosis not present

## 2018-10-23 DIAGNOSIS — G894 Chronic pain syndrome: Secondary | ICD-10-CM | POA: Diagnosis not present

## 2018-10-23 DIAGNOSIS — M47816 Spondylosis without myelopathy or radiculopathy, lumbar region: Secondary | ICD-10-CM | POA: Diagnosis not present

## 2018-10-23 DIAGNOSIS — M545 Low back pain: Secondary | ICD-10-CM | POA: Diagnosis not present

## 2018-10-23 NOTE — Telephone Encounter (Signed)
Not seen in over an year

## 2018-10-23 NOTE — Telephone Encounter (Signed)
Letter sent to patient.

## 2018-10-23 NOTE — Telephone Encounter (Signed)
Will provide courtesy refill only, 60 days no refills.  She  Needs to schedule visit either virtual or in office for further refills and WILL have to have labs done.

## 2018-10-23 NOTE — Telephone Encounter (Signed)
Requested medication (s) are due for refill today: yes  Requested medication (s) are on the active medication list: yes  Last refill:  07/29/2018  Future visit scheduled: no  Notes to clinic:  Review for refill   Requested Prescriptions  Pending Prescriptions Disp Refills   levothyroxine (SYNTHROID) 112 MCG tablet [Pharmacy Med Name: LEVOTHYROXINE 112 MCG TABLET] 90 tablet 2    Sig: TAKE 1 TABLET BY MOUTH EVERY DAY BEFORE BREAKFAST     Endocrinology:  Hypothyroid Agents Failed - 10/23/2018 12:58 AM      Failed - TSH needs to be rechecked within 3 months after an abnormal result. Refill until TSH is due.      Failed - TSH in normal range and within 360 days    TSH  Date Value Ref Range Status  08/09/2017 1.040 0.450 - 4.500 uIU/mL Final         Failed - Valid encounter within last 12 months    Recent Outpatient Visits          1 year ago Obstructive sleep apnea syndrome   [facility] Kathrine Haddock, NP   1 year ago Rectal bleeding   [facility] Trinna Post, PA-C   1 year ago Lymphadenitis   [facility] Kathrine Haddock, NP   1 year ago Hypothyroidism, unspecified type   [facility] Kathrine Haddock, NP   2 years ago Incontinence of feces, unspecified fecal incontinence type   [facility] Kathrine Haddock, NP

## 2018-11-01 DIAGNOSIS — F4321 Adjustment disorder with depressed mood: Secondary | ICD-10-CM | POA: Diagnosis not present

## 2018-11-20 DIAGNOSIS — F341 Dysthymic disorder: Secondary | ICD-10-CM | POA: Diagnosis not present

## 2018-11-20 DIAGNOSIS — F410 Panic disorder [episodic paroxysmal anxiety] without agoraphobia: Secondary | ICD-10-CM | POA: Diagnosis not present

## 2018-11-20 DIAGNOSIS — F411 Generalized anxiety disorder: Secondary | ICD-10-CM | POA: Diagnosis not present

## 2018-11-20 DIAGNOSIS — F4312 Post-traumatic stress disorder, chronic: Secondary | ICD-10-CM | POA: Diagnosis not present

## 2018-11-28 DIAGNOSIS — L989 Disorder of the skin and subcutaneous tissue, unspecified: Secondary | ICD-10-CM | POA: Diagnosis not present

## 2018-11-28 DIAGNOSIS — R300 Dysuria: Secondary | ICD-10-CM | POA: Diagnosis not present

## 2018-11-28 DIAGNOSIS — Z1231 Encounter for screening mammogram for malignant neoplasm of breast: Secondary | ICD-10-CM | POA: Diagnosis not present

## 2018-11-28 DIAGNOSIS — G629 Polyneuropathy, unspecified: Secondary | ICD-10-CM | POA: Diagnosis not present

## 2018-12-04 DIAGNOSIS — Z79899 Other long term (current) drug therapy: Secondary | ICD-10-CM | POA: Diagnosis not present

## 2018-12-04 DIAGNOSIS — L309 Dermatitis, unspecified: Secondary | ICD-10-CM | POA: Diagnosis not present

## 2018-12-04 DIAGNOSIS — Z1211 Encounter for screening for malignant neoplasm of colon: Secondary | ICD-10-CM | POA: Diagnosis not present

## 2018-12-04 DIAGNOSIS — F419 Anxiety disorder, unspecified: Secondary | ICD-10-CM | POA: Diagnosis not present

## 2018-12-07 DIAGNOSIS — M545 Low back pain: Secondary | ICD-10-CM | POA: Diagnosis not present

## 2018-12-07 DIAGNOSIS — M25512 Pain in left shoulder: Secondary | ICD-10-CM | POA: Diagnosis not present

## 2018-12-07 DIAGNOSIS — M47816 Spondylosis without myelopathy or radiculopathy, lumbar region: Secondary | ICD-10-CM | POA: Diagnosis not present

## 2018-12-07 DIAGNOSIS — G894 Chronic pain syndrome: Secondary | ICD-10-CM | POA: Diagnosis not present

## 2018-12-17 DIAGNOSIS — Z1231 Encounter for screening mammogram for malignant neoplasm of breast: Secondary | ICD-10-CM | POA: Diagnosis not present

## 2019-01-08 ENCOUNTER — Other Ambulatory Visit: Payer: Self-pay | Admitting: Nurse Practitioner

## 2019-01-08 NOTE — Telephone Encounter (Signed)
Not seen in a year and 3 months. Letter has already been sent to patient to schedule appointment.

## 2019-01-08 NOTE — Telephone Encounter (Signed)
Requested medication (s) are due for refill today: yes  Requested medication (s) are on the active medication list: yes  Last refill:  10/23/2018  Future visit scheduled: no  Notes to clinic: no valid encounter in last 12 months    Requested Prescriptions  Pending Prescriptions Disp Refills   levothyroxine (SYNTHROID) 112 MCG tablet [Pharmacy Med Name: LEVOTHYROXINE 112 MCG TABLET] 60 tablet 0    Sig: TAKE 1 TABLET (112 MCG TOTAL) BY MOUTH DAILY BEFORE BREAKFAST.     Endocrinology:  Hypothyroid Agents Failed - 01/08/2019  3:09 PM      Failed - TSH needs to be rechecked within 3 months after an abnormal result. Refill until TSH is due.      Failed - TSH in normal range and within 360 days    TSH  Date Value Ref Range Status  08/09/2017 1.040 0.450 - 4.500 uIU/mL Final         Failed - Valid encounter within last 12 months    Recent Outpatient Visits          1 year ago Obstructive sleep apnea syndrome   [facility] Kathrine Haddock, NP   1 year ago Rectal bleeding   [facility] Trinna Post, PA-C   1 year ago Lymphadenitis   [facility] Kathrine Haddock, NP   2 years ago Hypothyroidism, unspecified type   [facility] Kathrine Haddock, NP   2 years ago Incontinence of feces, unspecified fecal incontinence type   [facility] Kathrine Haddock, NP

## 2019-01-16 ENCOUNTER — Other Ambulatory Visit: Payer: Self-pay | Admitting: Nurse Practitioner

## 2019-01-16 NOTE — Telephone Encounter (Signed)
Patient has already be contacted to schedule a follow up for refills

## 2019-02-10 ENCOUNTER — Other Ambulatory Visit: Payer: Self-pay | Admitting: Nurse Practitioner

## 2019-02-11 NOTE — Telephone Encounter (Signed)
Not seen since 10/2017. Has been sent a letter to schedule appointment previously.

## 2019-02-11 NOTE — Telephone Encounter (Signed)
Needs a visit scheduled and then will supply enough to make it to visit. Needs labs to ensure proper dosing.

## 2019-02-11 NOTE — Telephone Encounter (Signed)
Letter generated and sent to patient.  

## 2019-02-21 ENCOUNTER — Telehealth: Payer: Self-pay

## 2019-02-21 NOTE — Telephone Encounter (Signed)
Address on file invalid. Unable to reach pt.
